# Patient Record
Sex: Female | Born: 1970
Health system: Southern US, Community
[De-identification: ages and names within clinical notes are randomized; demographics above are authoritative.]

## PROBLEM LIST (undated history)

## (undated) DIAGNOSIS — K582 Mixed irritable bowel syndrome: Secondary | ICD-10-CM

## (undated) DIAGNOSIS — F32A Depression, unspecified: Secondary | ICD-10-CM

## (undated) DIAGNOSIS — R945 Abnormal results of liver function studies: Secondary | ICD-10-CM

## (undated) DIAGNOSIS — G43909 Migraine, unspecified, not intractable, without status migrainosus: Secondary | ICD-10-CM

## (undated) DIAGNOSIS — G4700 Insomnia, unspecified: Secondary | ICD-10-CM

## (undated) DIAGNOSIS — E559 Vitamin D deficiency, unspecified: Secondary | ICD-10-CM

## (undated) DIAGNOSIS — F329 Major depressive disorder, single episode, unspecified: Secondary | ICD-10-CM

## (undated) DIAGNOSIS — R7989 Other specified abnormal findings of blood chemistry: Secondary | ICD-10-CM

## (undated) DIAGNOSIS — F909 Attention-deficit hyperactivity disorder, unspecified type: Secondary | ICD-10-CM

## (undated) HISTORY — PX: HERNIA REPAIR: SHX51

## (undated) HISTORY — PX: TUBAL LIGATION: SHX77

---

## 2004-07-11 ENCOUNTER — Emergency Department: Payer: Self-pay | Admitting: Emergency Medicine

## 2005-01-30 ENCOUNTER — Emergency Department: Payer: Self-pay | Admitting: Emergency Medicine

## 2005-01-30 ENCOUNTER — Other Ambulatory Visit: Payer: Self-pay

## 2006-04-16 ENCOUNTER — Emergency Department: Payer: Self-pay | Admitting: Unknown Physician Specialty

## 2010-08-05 ENCOUNTER — Ambulatory Visit: Payer: Self-pay | Admitting: Sports Medicine

## 2011-01-26 ENCOUNTER — Ambulatory Visit: Payer: Self-pay | Admitting: Family Medicine

## 2015-06-15 DIAGNOSIS — F909 Attention-deficit hyperactivity disorder, unspecified type: Secondary | ICD-10-CM | POA: Insufficient documentation

## 2015-07-07 DIAGNOSIS — F909 Attention-deficit hyperactivity disorder, unspecified type: Secondary | ICD-10-CM | POA: Insufficient documentation

## 2015-07-07 DIAGNOSIS — F32A Depression, unspecified: Secondary | ICD-10-CM | POA: Insufficient documentation

## 2015-07-07 DIAGNOSIS — F329 Major depressive disorder, single episode, unspecified: Secondary | ICD-10-CM | POA: Insufficient documentation

## 2015-07-07 DIAGNOSIS — G43909 Migraine, unspecified, not intractable, without status migrainosus: Secondary | ICD-10-CM | POA: Insufficient documentation

## 2015-07-26 ENCOUNTER — Other Ambulatory Visit: Payer: Self-pay | Admitting: Internal Medicine

## 2015-07-26 DIAGNOSIS — R945 Abnormal results of liver function studies: Secondary | ICD-10-CM

## 2015-07-26 DIAGNOSIS — R7989 Other specified abnormal findings of blood chemistry: Secondary | ICD-10-CM

## 2015-08-02 ENCOUNTER — Ambulatory Visit
Admission: RE | Admit: 2015-08-02 | Discharge: 2015-08-02 | Disposition: A | Discharge status: Home / Self Care | Payer: BLUE CROSS/BLUE SHIELD | Source: Ambulatory Visit | Attending: Internal Medicine | Admitting: Internal Medicine

## 2015-08-02 DIAGNOSIS — R945 Abnormal results of liver function studies: Secondary | ICD-10-CM

## 2015-08-02 DIAGNOSIS — R7989 Other specified abnormal findings of blood chemistry: Secondary | ICD-10-CM | POA: Insufficient documentation

## 2016-04-20 ENCOUNTER — Other Ambulatory Visit: Payer: Self-pay | Admitting: Internal Medicine

## 2016-04-20 DIAGNOSIS — R1084 Generalized abdominal pain: Secondary | ICD-10-CM

## 2016-04-28 ENCOUNTER — Ambulatory Visit
Admission: RE | Admit: 2016-04-28 | Discharge: 2016-04-28 | Disposition: A | Discharge status: Home / Self Care | Payer: BLUE CROSS/BLUE SHIELD | Source: Ambulatory Visit | Attending: Internal Medicine | Admitting: Internal Medicine

## 2016-04-28 ENCOUNTER — Encounter: Payer: Self-pay | Admitting: Radiology

## 2016-04-28 DIAGNOSIS — R1084 Generalized abdominal pain: Secondary | ICD-10-CM | POA: Diagnosis present

## 2016-04-28 MED ORDER — IOPAMIDOL (ISOVUE-300) INJECTION 61%
100.0000 mL | Freq: Once | INTRAVENOUS | Status: AC | PRN
  Administered 2016-04-28: 100 mL via INTRAVENOUS

## 2016-10-18 DIAGNOSIS — F5104 Psychophysiologic insomnia: Secondary | ICD-10-CM | POA: Insufficient documentation

## 2016-10-18 DIAGNOSIS — K582 Mixed irritable bowel syndrome: Secondary | ICD-10-CM | POA: Insufficient documentation

## 2017-02-01 ENCOUNTER — Other Ambulatory Visit: Payer: Self-pay | Admitting: Internal Medicine

## 2017-02-01 DIAGNOSIS — R1084 Generalized abdominal pain: Secondary | ICD-10-CM

## 2017-02-05 ENCOUNTER — Ambulatory Visit
Admission: RE | Admit: 2017-02-05 | Discharge: 2017-02-05 | Disposition: A | Discharge status: Home / Self Care | Payer: BLUE CROSS/BLUE SHIELD | Source: Ambulatory Visit | Attending: Internal Medicine | Admitting: Internal Medicine

## 2017-02-05 DIAGNOSIS — R1084 Generalized abdominal pain: Secondary | ICD-10-CM | POA: Insufficient documentation

## 2017-02-05 MED ORDER — IOPAMIDOL (ISOVUE-300) INJECTION 61%
100.0000 mL | Freq: Once | INTRAVENOUS | Status: AC | PRN
  Administered 2017-02-05: 100 mL via INTRAVENOUS

## 2017-04-26 ENCOUNTER — Other Ambulatory Visit: Payer: Self-pay | Admitting: Internal Medicine

## 2017-04-26 DIAGNOSIS — R945 Abnormal results of liver function studies: Secondary | ICD-10-CM

## 2017-04-26 DIAGNOSIS — R7989 Other specified abnormal findings of blood chemistry: Secondary | ICD-10-CM | POA: Insufficient documentation

## 2017-05-01 ENCOUNTER — Ambulatory Visit
Admission: RE | Admit: 2017-05-01 | Discharge: 2017-05-01 | Disposition: A | Discharge status: Home / Self Care | Payer: 59 | Source: Ambulatory Visit | Attending: Internal Medicine | Admitting: Internal Medicine

## 2017-05-01 DIAGNOSIS — R945 Abnormal results of liver function studies: Secondary | ICD-10-CM

## 2017-05-01 DIAGNOSIS — R932 Abnormal findings on diagnostic imaging of liver and biliary tract: Secondary | ICD-10-CM | POA: Diagnosis not present

## 2017-05-01 DIAGNOSIS — R7989 Other specified abnormal findings of blood chemistry: Secondary | ICD-10-CM

## 2017-09-26 DIAGNOSIS — E559 Vitamin D deficiency, unspecified: Secondary | ICD-10-CM | POA: Insufficient documentation

## 2018-04-15 ENCOUNTER — Other Ambulatory Visit: Payer: Self-pay | Admitting: Internal Medicine

## 2018-04-15 DIAGNOSIS — M7989 Other specified soft tissue disorders: Secondary | ICD-10-CM

## 2018-04-19 ENCOUNTER — Ambulatory Visit
Admission: RE | Admit: 2018-04-19 | Discharge: 2018-04-19 | Disposition: A | Discharge status: Home / Self Care | Payer: 59 | Source: Ambulatory Visit | Attending: Internal Medicine | Admitting: Internal Medicine

## 2018-04-19 DIAGNOSIS — M7989 Other specified soft tissue disorders: Secondary | ICD-10-CM | POA: Insufficient documentation

## 2018-07-11 ENCOUNTER — Other Ambulatory Visit: Payer: Self-pay

## 2018-07-11 ENCOUNTER — Encounter: Payer: Self-pay | Admitting: Emergency Medicine

## 2018-07-11 ENCOUNTER — Inpatient Hospital Stay
Admission: EM | Admit: 2018-07-11 | Discharge: 2018-07-15 | DRG: 440 | Disposition: A | Discharge status: Home / Self Care | Payer: 59 | Attending: Internal Medicine | Admitting: Internal Medicine

## 2018-07-11 ENCOUNTER — Emergency Department: Payer: 59

## 2018-07-11 DIAGNOSIS — E559 Vitamin D deficiency, unspecified: Secondary | ICD-10-CM | POA: Diagnosis present

## 2018-07-11 DIAGNOSIS — K2980 Duodenitis without bleeding: Secondary | ICD-10-CM | POA: Diagnosis present

## 2018-07-11 DIAGNOSIS — Z79899 Other long term (current) drug therapy: Secondary | ICD-10-CM | POA: Diagnosis not present

## 2018-07-11 DIAGNOSIS — Z9851 Tubal ligation status: Secondary | ICD-10-CM

## 2018-07-11 DIAGNOSIS — E860 Dehydration: Secondary | ICD-10-CM | POA: Diagnosis present

## 2018-07-11 DIAGNOSIS — K589 Irritable bowel syndrome without diarrhea: Secondary | ICD-10-CM | POA: Diagnosis present

## 2018-07-11 DIAGNOSIS — G4700 Insomnia, unspecified: Secondary | ICD-10-CM | POA: Diagnosis present

## 2018-07-11 DIAGNOSIS — K852 Alcohol induced acute pancreatitis without necrosis or infection: Principal | ICD-10-CM | POA: Diagnosis present

## 2018-07-11 DIAGNOSIS — E876 Hypokalemia: Secondary | ICD-10-CM | POA: Diagnosis present

## 2018-07-11 DIAGNOSIS — K760 Fatty (change of) liver, not elsewhere classified: Secondary | ICD-10-CM | POA: Diagnosis present

## 2018-07-11 DIAGNOSIS — Z803 Family history of malignant neoplasm of breast: Secondary | ICD-10-CM | POA: Diagnosis not present

## 2018-07-11 DIAGNOSIS — Z888 Allergy status to other drugs, medicaments and biological substances status: Secondary | ICD-10-CM | POA: Diagnosis not present

## 2018-07-11 DIAGNOSIS — K859 Acute pancreatitis without necrosis or infection, unspecified: Secondary | ICD-10-CM

## 2018-07-11 DIAGNOSIS — F909 Attention-deficit hyperactivity disorder, unspecified type: Secondary | ICD-10-CM | POA: Diagnosis present

## 2018-07-11 DIAGNOSIS — K219 Gastro-esophageal reflux disease without esophagitis: Secondary | ICD-10-CM | POA: Diagnosis present

## 2018-07-11 HISTORY — DX: Abnormal results of liver function studies: R94.5

## 2018-07-11 HISTORY — DX: Migraine, unspecified, not intractable, without status migrainosus: G43.909

## 2018-07-11 HISTORY — DX: Mixed irritable bowel syndrome: K58.2

## 2018-07-11 HISTORY — DX: Other specified abnormal findings of blood chemistry: R79.89

## 2018-07-11 HISTORY — DX: Depression, unspecified: F32.A

## 2018-07-11 HISTORY — DX: Vitamin D deficiency, unspecified: E55.9

## 2018-07-11 HISTORY — DX: Insomnia, unspecified: G47.00

## 2018-07-11 HISTORY — DX: Major depressive disorder, single episode, unspecified: F32.9

## 2018-07-11 HISTORY — DX: Attention-deficit hyperactivity disorder, unspecified type: F90.9

## 2018-07-11 LAB — URINALYSIS, COMPLETE (UACMP) WITH MICROSCOPIC
Bilirubin Urine: NEGATIVE
GLUCOSE, UA: NEGATIVE mg/dL
HGB URINE DIPSTICK: NEGATIVE
Ketones, ur: NEGATIVE mg/dL
Leukocytes, UA: NEGATIVE
NITRITE: NEGATIVE
PH: 6 (ref 5.0–8.0)
PROTEIN: NEGATIVE mg/dL
Specific Gravity, Urine: 1.011 (ref 1.005–1.030)

## 2018-07-11 LAB — CBC
HCT: 36.6 % (ref 36.0–46.0)
Hemoglobin: 13.1 g/dL (ref 12.0–15.0)
MCH: 35.2 pg — ABNORMAL HIGH (ref 26.0–34.0)
MCHC: 35.8 g/dL (ref 30.0–36.0)
MCV: 98.4 fL (ref 80.0–100.0)
PLATELETS: 407 10*3/uL — AB (ref 150–400)
RBC: 3.72 MIL/uL — AB (ref 3.87–5.11)
RDW: 14.8 % (ref 11.5–15.5)
WBC: 16.4 10*3/uL — ABNORMAL HIGH (ref 4.0–10.5)
nRBC: 0 % (ref 0.0–0.2)

## 2018-07-11 LAB — POCT PREGNANCY, URINE
Preg Test, Ur: NEGATIVE
Preg Test, Ur: NEGATIVE

## 2018-07-11 LAB — LIPID PANEL
CHOL/HDL RATIO: 2.2 ratio
CHOLESTEROL: 175 mg/dL (ref 0–200)
HDL: 81 mg/dL (ref >40)
LDL Cholesterol: 51 mg/dL (ref 0–99)
Triglycerides: 216 mg/dL — ABNORMAL HIGH (ref <150)
VLDL: 43 mg/dL — ABNORMAL HIGH (ref 0–40)

## 2018-07-11 LAB — COMPREHENSIVE METABOLIC PANEL
ALT: 27 U/L (ref 0–44)
ANION GAP: 12 (ref 5–15)
AST: 60 U/L — ABNORMAL HIGH (ref 15–41)
Albumin: 4.2 g/dL (ref 3.5–5.0)
Alkaline Phosphatase: 95 U/L (ref 38–126)
BUN: 9 mg/dL (ref 6–20)
CHLORIDE: 96 mmol/L — AB (ref 98–111)
CO2: 25 mmol/L (ref 22–32)
Calcium: 9 mg/dL (ref 8.9–10.3)
Creatinine, Ser: 0.54 mg/dL (ref 0.44–1.00)
GFR calc Af Amer: >60 mL/min (ref >60)
GFR calc non Af Amer: >60 mL/min (ref >60)
GLUCOSE: 107 mg/dL — AB (ref 70–99)
POTASSIUM: 4.4 mmol/L (ref 3.5–5.1)
SODIUM: 133 mmol/L — AB (ref 135–145)
TOTAL PROTEIN: 7.3 g/dL (ref 6.5–8.1)
Total Bilirubin: 0.9 mg/dL (ref 0.3–1.2)

## 2018-07-11 LAB — LIPASE, BLOOD: LIPASE: 515 U/L — AB (ref 11–51)

## 2018-07-11 MED ORDER — FOLIC ACID 1 MG PO TABS
1.0000 mg | ORAL_TABLET | Freq: Every day | ORAL | Status: DC
  Administered 2018-07-11 – 2018-07-15 (×4): 1 mg via ORAL
  Filled 2018-07-11 (×4): qty 1

## 2018-07-11 MED ORDER — ONDANSETRON HCL 4 MG/2ML IJ SOLN
4.0000 mg | Freq: Four times a day (QID) | INTRAMUSCULAR | Status: DC | PRN
  Administered 2018-07-12 (×2): 4 mg via INTRAVENOUS
  Filled 2018-07-11 (×2): qty 2

## 2018-07-11 MED ORDER — VITAMIN B-1 100 MG PO TABS
100.0000 mg | ORAL_TABLET | Freq: Every day | ORAL | Status: DC
  Administered 2018-07-13 – 2018-07-15 (×3): 100 mg via ORAL
  Filled 2018-07-11 (×3): qty 1

## 2018-07-11 MED ORDER — ADULT MULTIVITAMIN W/MINERALS CH
1.0000 | ORAL_TABLET | Freq: Every day | ORAL | Status: DC
  Administered 2018-07-11 – 2018-07-15 (×4): 1 via ORAL
  Filled 2018-07-11 (×4): qty 1

## 2018-07-11 MED ORDER — THIAMINE HCL 100 MG/ML IJ SOLN
100.0000 mg | Freq: Every day | INTRAMUSCULAR | Status: DC
  Administered 2018-07-12: 100 mg via INTRAVENOUS
  Filled 2018-07-11: qty 2

## 2018-07-11 MED ORDER — HYDROMORPHONE HCL 1 MG/ML IJ SOLN
1.0000 mg | INTRAMUSCULAR | Status: DC | PRN
  Administered 2018-07-11 – 2018-07-12 (×3): 1 mg via INTRAVENOUS
  Filled 2018-07-11 (×3): qty 1

## 2018-07-11 MED ORDER — SODIUM CHLORIDE 0.9 % IV BOLUS
1000.0000 mL | Freq: Once | INTRAVENOUS | Status: AC
  Administered 2018-07-11: 1000 mL via INTRAVENOUS

## 2018-07-11 MED ORDER — LACTATED RINGERS IV SOLN
INTRAVENOUS | Status: DC
  Administered 2018-07-11 – 2018-07-15 (×13): via INTRAVENOUS

## 2018-07-11 MED ORDER — HYDROMORPHONE HCL 1 MG/ML IJ SOLN
INTRAMUSCULAR | Status: AC
  Administered 2018-07-11: 1 mg via INTRAVENOUS
  Filled 2018-07-11: qty 1

## 2018-07-11 MED ORDER — ZOLPIDEM TARTRATE 5 MG PO TABS: 5.0000 mg | ORAL_TABLET | Freq: Every day | ORAL | Status: DC

## 2018-07-11 MED ORDER — ONDANSETRON HCL 4 MG/2ML IJ SOLN
INTRAMUSCULAR | Status: AC
  Administered 2018-07-11: 4 mg via INTRAVENOUS
  Filled 2018-07-11: qty 2

## 2018-07-11 MED ORDER — IOPAMIDOL (ISOVUE-300) INJECTION 61%
100.0000 mL | Freq: Once | INTRAVENOUS | Status: AC | PRN
  Administered 2018-07-11: 100 mL via INTRAVENOUS

## 2018-07-11 MED ORDER — ONDANSETRON HCL 4 MG/2ML IJ SOLN
4.0000 mg | Freq: Once | INTRAMUSCULAR | Status: AC
  Administered 2018-07-11: 4 mg via INTRAVENOUS
  Filled 2018-07-11: qty 2

## 2018-07-11 MED ORDER — ONDANSETRON HCL 4 MG PO TABS: 4.0000 mg | ORAL_TABLET | Freq: Four times a day (QID) | ORAL | Status: DC | PRN

## 2018-07-11 MED ORDER — ZOLPIDEM TARTRATE 5 MG PO TABS
5.0000 mg | ORAL_TABLET | Freq: Every day | ORAL | Status: DC
  Administered 2018-07-11 – 2018-07-14 (×3): 5 mg via ORAL
  Filled 2018-07-11 (×3): qty 1

## 2018-07-11 MED ORDER — ACETAMINOPHEN 325 MG PO TABS
650.0000 mg | ORAL_TABLET | Freq: Four times a day (QID) | ORAL | Status: DC | PRN
  Administered 2018-07-13 – 2018-07-15 (×3): 650 mg via ORAL
  Filled 2018-07-11 (×3): qty 2

## 2018-07-11 MED ORDER — LORAZEPAM 2 MG/ML IJ SOLN
0.0000 mg | Freq: Four times a day (QID) | INTRAMUSCULAR | Status: AC
  Administered 2018-07-12 (×2): 2 mg via INTRAVENOUS
  Filled 2018-07-11 (×2): qty 1

## 2018-07-11 MED ORDER — HYDROMORPHONE HCL 1 MG/ML IJ SOLN
1.0000 mg | Freq: Once | INTRAMUSCULAR | Status: AC
  Administered 2018-07-11: 1 mg via INTRAVENOUS
  Filled 2018-07-11: qty 1

## 2018-07-11 MED ORDER — LORAZEPAM 1 MG PO TABS: 1.0000 mg | ORAL_TABLET | Freq: Four times a day (QID) | ORAL | Status: AC | PRN

## 2018-07-11 MED ORDER — ONDANSETRON 4 MG PO TBDP
ORAL_TABLET | ORAL | Status: AC
  Administered 2018-07-11: 4 mg via ORAL
  Filled 2018-07-11: qty 1

## 2018-07-11 MED ORDER — LORAZEPAM 2 MG/ML IJ SOLN: 0.0000 mg | Freq: Two times a day (BID) | INTRAMUSCULAR | Status: DC

## 2018-07-11 MED ORDER — ONDANSETRON 4 MG PO TBDP
4.0000 mg | ORAL_TABLET | Freq: Once | ORAL | Status: AC | PRN
  Administered 2018-07-11: 4 mg via ORAL

## 2018-07-11 MED ORDER — LORAZEPAM 2 MG/ML IJ SOLN
1.0000 mg | Freq: Four times a day (QID) | INTRAMUSCULAR | Status: AC | PRN
  Administered 2018-07-12: 1 mg via INTRAVENOUS
  Filled 2018-07-11: qty 1

## 2018-07-11 MED ORDER — SENNOSIDES-DOCUSATE SODIUM 8.6-50 MG PO TABS: 1.0000 | ORAL_TABLET | Freq: Every evening | ORAL | Status: DC | PRN

## 2018-07-11 MED ORDER — LORAZEPAM 2 MG/ML IJ SOLN
1.0000 mg | Freq: Once | INTRAMUSCULAR | Status: AC
Start: 2018-07-11 — End: 2018-07-11
  Administered 2018-07-11: 1 mg via INTRAVENOUS
  Filled 2018-07-11: qty 1

## 2018-07-11 MED ORDER — ACETAMINOPHEN 650 MG RE SUPP: 650.0000 mg | Freq: Four times a day (QID) | RECTAL | Status: DC | PRN

## 2018-07-11 MED ORDER — ENOXAPARIN SODIUM 40 MG/0.4ML ~~LOC~~ SOLN
40.0000 mg | SUBCUTANEOUS | Status: DC
  Administered 2018-07-11 – 2018-07-14 (×4): 40 mg via SUBCUTANEOUS
  Filled 2018-07-11 (×4): qty 0.4

## 2018-07-11 NOTE — ED Provider Notes (Signed)
[facility] Emergency Department Provider Note  ____________________________________________  Time seen: Approximately 7:06 PM  I have reviewed the triage vital signs and the nursing notes.   HISTORY  Chief Complaint Abdominal Pain    HPI Nicolette Bangndrea L Saurage is a 47 y.o. female with a history of ADHD depression and IBS who complains of upper abdominal pain radiating to the back for the past 2 days, constant, waxing and waning.  Decreased appetite and oral intake.  Nausea and vomiting for the past 24 hours as well.  No diarrhea or constipation.  No fever or chills.  Never had pain like this before.  No history of biliary disease.  Pain is severe and achy.     Past Medical History:  Diagnosis Date  . Abnormal LFTs   . Adult ADHD   . Depression   . Insomnia   . Irritable bowel syndrome with both constipation and diarrhea   . Migraines   . Vitamin D deficiency      Patient Active Problem List   Diagnosis Date Noted  . Acute pancreatitis 07/11/2018     Past Surgical History:  Procedure Laterality Date  . HERNIA REPAIR    . TUBAL LIGATION       Prior to Admission medications   Not on File  None   Allergies Meloxicam   No family history on file.  Social History Social History   Tobacco Use  . Smoking status: Never Smoker  . Smokeless tobacco: Never Used  Substance Use Topics  . Alcohol use: Yes  . Drug use: Never    Review of Systems  Constitutional:   No fever or chills.  ENT:   No sore throat. No rhinorrhea. Cardiovascular:   No chest pain or syncope. Respiratory:   No dyspnea or cough. Gastrointestinal:   Positive as above abdominal pain and vomiting Musculoskeletal:   Negative for focal pain or swelling All other systems reviewed and are negative except as documented above in ROS and HPI.  ____________________________________________   PHYSICAL EXAM:  VITAL SIGNS: ED Triage Vitals  Enc Vitals Group     BP 07/11/18  1627 (!) 150/91     Pulse Rate 07/11/18 1627 94     Resp 07/11/18 1627 16     Temp 07/11/18 1627 98.5 F (36.9 C)     Temp Source 07/11/18 1627 Oral     SpO2 07/11/18 1627 98 %     Weight 07/11/18 1627 163 lb (73.9 kg)     Height 07/11/18 1627 5\' 3"  (1.6 m)     Head Circumference --      Peak Flow --      Pain Score 07/11/18 1633 8     Pain Loc --      Pain Edu? --      Excl. in GC? --     Vital signs reviewed, nursing assessments reviewed.   Constitutional:   Alert and oriented. Non-toxic appearance. Eyes:   Conjunctivae are normal. EOMI. PERRL. ENT      Head:   Normocephalic and atraumatic.      Nose:   No congestion/rhinnorhea.       Mouth/Throat:   Dry mucous, no pharyngeal erythema. No peritonsillar mass.       Neck:   No meningismus. Full ROM. Hematological/Lymphatic/Immunilogical:   No cervical lymphadenopathy. Cardiovascular:   RRR. Symmetric bilateral radial and DP pulses.  No murmurs. Cap refill less than 2 seconds. Respiratory:   Normal respiratory effort without tachypnea/retractions.  Breath sounds are clear and equal bilaterally. No wheezes/rales/rhonchi. Gastrointestinal:   Soft with generalized tenderness and guarding.  No rebound or rigidity.  Pronounced tenderness in the epigastrium.. Non distended. There is no CVA tenderness.    Musculoskeletal:   Normal range of motion in all extremities. No joint effusions.  No lower extremity tenderness.  No edema. Neurologic:   Normal speech and language.  Motor grossly intact. No acute focal neurologic deficits are appreciated.  Skin:    Skin is warm, dry and intact. No rash noted.  No petechiae, purpura, or bullae.  ____________________________________________    LABS (pertinent positives/negatives) (all labs ordered are listed, but only abnormal results are displayed) Labs Reviewed  LIPASE, BLOOD - Abnormal; Notable for the following components:      Result Value   Lipase 515 (*)    All other components within  normal limits  COMPREHENSIVE METABOLIC PANEL - Abnormal; Notable for the following components:   Sodium 133 (*)    Chloride 96 (*)    Glucose, Bld 107 (*)    AST 60 (*)    All other components within normal limits  CBC - Abnormal; Notable for the following components:   WBC 16.4 (*)    RBC 3.72 (*)    MCH 35.2 (*)    Platelets 407 (*)    All other components within normal limits  URINALYSIS, COMPLETE (UACMP) WITH MICROSCOPIC - Abnormal; Notable for the following components:   Color, Urine YELLOW (*)    APPearance HAZY (*)    Bacteria, UA RARE (*)    All other components within normal limits  LIPID PANEL  POCT PREGNANCY, URINE  POCT PREGNANCY, URINE  POC URINE PREG, ED   ____________________________________________   EKG    ____________________________________________    RADIOLOGY  No results found.  ____________________________________________   PROCEDURES Procedures  ____________________________________________  DIFFERENTIAL DIAGNOSIS   Bowel perforation, obstruction, ileus, pancreatititis, biliary disease  CLINICAL IMPRESSION / ASSESSMENT AND PLAN / ED COURSE  Pertinent labs & imaging results that were available during my care of the patient were reviewed by me and considered in my medical decision making (see chart for details).      Clinical Course as of Jul 11 1908  Thu Jul 11, 2018  1750 Patient presents with upper abdominal pain radiating to the back.  Lipase is 500 consistent with pancreatitis.  However she has a leukocytosis of 16,000 and severe generalized tenderness without rebound or rigidity.  Presentation is somewhat vague but concerning for perforation obstruction or biliary disease.  I will obtain a CT scan to further evaluate.  Dilaudid 1 mg IV, Zofran 4 mill grams IV, liter of saline bolus for rehydration.  Plan to admit.   [PS]    Clinical Course User Index [PS] Sharman Cheek, MD      ----------------------------------------- 7:11 PM on 07/11/2018 -----------------------------------------  Still waiting for report on CT imaging.  By my review there is pancreatitis of the head of the pancreas.  I do not see any obvious free air or pseudocyst/abscess.. ____________________________________________   FINAL CLINICAL IMPRESSION(S) / ED DIAGNOSES    Final diagnoses:  Acute pancreatitis, unspecified complication status, unspecified pancreatitis type     ED Discharge Orders    None      Portions of this note were generated with dragon dictation software. Dictation errors may occur despite best attempts at proofreading.    Sharman Cheek, MD 07/11/18 [phone]

## 2018-07-11 NOTE — H&P (Signed)
[facility] [place] [facility]   PATIENT NAME: Tabitha Carr    MR#:  [id number]  DATE OF BIRTH:  1971-02-16  DATE OF ADMISSION:  07/11/2018  PRIMARY CARE PHYSICIAN: Marguarite Arbour, MD   REQUESTING/REFERRING PHYSICIAN:   CHIEF COMPLAINT:   Chief Complaint  Patient presents with  . Abdominal Pain    HISTORY OF PRESENT ILLNESS: Tabitha Carr  is a 47 y.o. female with a known history of depression, vitamin D deficiency, alcohol abuse presented to the emergency room for abdominal pain.  Abdominal pain has been going on for 1 day sharp in nature located in the epigastrium and right upper quadrant.  Patient has some nausea and vomiting for the last few days.  The abdominal pain is sharp and 7 out of 10 on a scale of 1-10.  Patient was evaluated in the emergency room her lipase level was elevated.  CT abdomen was reviewed which showed inflammation around the pancreas and duodenal area.  Hospitalist service was consulted for further care.  PAST MEDICAL HISTORY:   Past Medical History:  Diagnosis Date  . Abnormal LFTs   . Adult ADHD   . Depression   . Insomnia   . Irritable bowel syndrome with both constipation and diarrhea   . Migraines   . Vitamin D deficiency     PAST SURGICAL HISTORY:  Past Surgical History:  Procedure Laterality Date  . HERNIA REPAIR    . TUBAL LIGATION      SOCIAL HISTORY:  Social History   Tobacco Use  . Smoking status: Never Smoker  . Smokeless tobacco: Never Used  Substance Use Topics  . Alcohol use: Yes    FAMILY HISTORY:  Mother has breast cancer and alive Father has hypertension and alive  DRUG ALLERGIES:  Allergies  Allergen Reactions  . Meloxicam Swelling    REVIEW OF SYSTEMS:   CONSTITUTIONAL: No fever, fatigue or weakness.  EYES: No blurred or double vision.  EARS, NOSE, AND THROAT: No tinnitus or ear pain.  RESPIRATORY: No cough, shortness of breath, wheezing or hemoptysis.  CARDIOVASCULAR:  No chest pain, orthopnea, edema.  GASTROINTESTINAL: Has nausea, vomiting,  abdominal pain.  No diarrhea GENITOURINARY: No dysuria, hematuria.  ENDOCRINE: No polyuria, nocturia,  HEMATOLOGY: No anemia, easy bruising or bleeding SKIN: No rash or lesion. MUSCULOSKELETAL: No joint pain or arthritis.   NEUROLOGIC: No tingling, numbness, weakness.  PSYCHIATRY: No anxiety or depression.   MEDICATIONS AT HOME:  Prior to Admission medications   Medication Sig Start Date End Date Taking? Authorizing Provider  Cholecalciferol (VITAMIN D3) 125 MCG (5000 UT) TABS Take 5,000 Units by mouth at bedtime.   Yes [provider]  HYDROcodone-homatropine (HYCODAN) 5-1.5 MG/5ML syrup Take 5 mLs by mouth every 6 (six) hours as needed for cough.    Yes [provider]  pantoprazole (PROTONIX) 40 MG tablet Take 40 mg by mouth at bedtime.   Yes [provider]  phentermine (ADIPEX-P) 37.5 MG tablet Take 37.5 mg by mouth daily before breakfast.   Yes [provider]  zolpidem (AMBIEN) 10 MG tablet Take 10 mg by mouth at bedtime.   Yes [provider]      PHYSICAL EXAMINATION:   VITAL SIGNS: Blood pressure (!) 154/88, pulse (!) 114, temperature 98.5 F (36.9 C), temperature source Oral, resp. rate 16, height 5\' 3"  (1.6 m), weight 73.9 kg, SpO2 97 %.  GENERAL:  47 y.o.-year-old patient lying in the bed with no acute distress.  EYES: Pupils equal, round, reactive to light and accommodation. No scleral icterus. Extraocular muscles intact.  HEENT: Head atraumatic, normocephalic. Oropharynx dry and nasopharynx clear.  NECK:  Supple, no jugular venous distention. No thyroid enlargement, no tenderness.  LUNGS: Normal breath sounds bilaterally, no wheezing, rales,rhonchi or crepitation. No use of accessory muscles of respiration.  CARDIOVASCULAR: S1, S2 normal. No murmurs, rubs, or gallops.  ABDOMEN: Soft, tenderness in the epigastrium and right upper quadrant,  nondistended. Bowel sounds present. No organomegaly or mass.  EXTREMITIES: No pedal edema, cyanosis, or clubbing.  NEUROLOGIC: Cranial nerves II through XII are intact. Muscle strength 5/5 in all extremities. Sensation intact. Gait not checked.  PSYCHIATRIC: The patient is alert and oriented x 3.  SKIN: No obvious rash, lesion, or ulcer.   LABORATORY PANEL:   CBC Recent Labs  Lab 07/11/18 1628  WBC 16.4*  HGB 13.1  HCT 36.6  PLT 407*  MCV 98.4  MCH 35.2*  MCHC 35.8  RDW 14.8   ------------------------------------------------------------------------------------------------------------------  Chemistries  Recent Labs  Lab 07/11/18 1628  NA 133*  K 4.4  CL 96*  CO2 25  GLUCOSE 107*  BUN 9  CREATININE 0.54  CALCIUM 9.0  AST 60*  ALT 27  ALKPHOS 95  BILITOT 0.9   ------------------------------------------------------------------------------------------------------------------ estimated creatinine clearance is 82.8 mL/min (by C-G formula based on SCr of 0.54 mg/dL). ------------------------------------------------------------------------------------------------------------------ No results for input(s): TSH, T4TOTAL, T3FREE, THYROIDAB in the last 72 hours.  Invalid input(s): FREET3   Coagulation profile No results for input(s): INR, PROTIME in the last 168 hours. ------------------------------------------------------------------------------------------------------------------- No results for input(s): DDIMER in the last 72 hours. -------------------------------------------------------------------------------------------------------------------  Cardiac Enzymes No results for input(s): CKMB, TROPONINI, MYOGLOBIN in the last 168 hours.  Invalid input(s): CK ------------------------------------------------------------------------------------------------------------------ Invalid input(s):  POCBNP  ---------------------------------------------------------------------------------------------------------------  Urinalysis    Component Value Date/Time   COLORURINE YELLOW (A) 07/11/2018 1628   APPEARANCEUR HAZY (A) 07/11/2018 1628   LABSPEC 1.011 07/11/2018 1628   PHURINE 6.0 07/11/2018 1628   GLUCOSEU NEGATIVE 07/11/2018 1628   HGBUR NEGATIVE 07/11/2018 1628   BILIRUBINUR NEGATIVE 07/11/2018 1628   KETONESUR NEGATIVE 07/11/2018 1628   PROTEINUR NEGATIVE 07/11/2018 1628   NITRITE NEGATIVE 07/11/2018 1628   LEUKOCYTESUR NEGATIVE 07/11/2018 1628     RADIOLOGY: Ct Abdomen Pelvis W Contrast  Result Date: 07/11/2018 CLINICAL DATA:  Patient with nausea and vomiting. EXAM: CT ABDOMEN AND PELVIS WITH CONTRAST TECHNIQUE: Multidetector CT imaging of the abdomen and pelvis was performed using the standard protocol following bolus administration of intravenous contrast. CONTRAST:  100mL ISOVUE-300 IOPAMIDOL (ISOVUE-300) INJECTION 61% COMPARISON:  CT abdomen pelvis 02/05/2017 FINDINGS: Lower chest: Normal heart size. No pericardial effusion. Lung bases are clear. No pleural effusion. Hepatobiliary: The liver is normal in size and contour. Subcentimeter too small to characterize low-attenuation lesion right hepatic lobe (image 19; series 2). Gallbladder is unremarkable. No intrahepatic or extrahepatic biliary ductal dilatation. Pancreas: Pancreatic head and uncinate process is edematous with surrounding fluid and inflammatory stranding. Spleen: Unremarkable Adrenals/Urinary Tract: Normal adrenal glands. Kidneys enhance symmetrically with contrast. No hydronephrosis. Urinary bladder is unremarkable. Stomach/Bowel: Wall thickening of the second and third portion of the duodenum. No evidence for small bowel obstruction. No free intraperitoneal air. Vascular/Lymphatic: Normal caliber abdominal aorta. No retroperitoneal lymphadenopathy. Reproductive: Uterus and adnexal structures unremarkable. Other:  None. Musculoskeletal: Lumbar spine degenerative changes. Lower thoracic spine degenerative changes. No aggressive or acute appearing osseous lesions. IMPRESSION: 1. Inflammatory stranding and fluid about the pancreatic head and uncinate process. Additionally, there is wall  thickening of the adjacent second and third portion of the duodenum. Findings are favored to represent focal pancreatitis with reactive inflammatory changes of the duodenum. The possibility of a primary duodenitis is not entirely excluded. Electronically Signed   By: Annia Belt M.D.   On: 07/11/2018 19:14    EKG: Orders placed or performed in visit on 01/30/05  . EKG 12-Lead    IMPRESSION AND PLAN:  47 year old female patient with history of depression, migraines, vitamin D deficiency presented to the emergency room for abdominal pain nausea and vomiting  -Acute pancreatitis Admit patient to medical floor IV fluid hydration Clear liquid diet Monitor lipase level Pain management with IV Dilaudid  -Acute duodenitis Start patient on proton pump inhibitor GI consultation  -Dehydration With IV fluids  -DVT prophylaxis with subcu Lovenox daily  -History of alcohol use Last evening 2 days ago CIWA protocol  All the records are reviewed and case discussed with ED provider. Management plans discussed with the patient, family and they are in agreement.  CODE STATUS:Full code    TOTAL TIME TAKING CARE OF THIS PATIENT: 52 minutes.    Ihor Austin M.D on 07/11/2018 at 7:20 PM  Between 7am to 6pm - Pager - [phone]  After 6pm go to [email or web address] - password [email or web address] [facility]  [facility] [facility]  Office  [phone]  CC: Primary care physician; Marguarite Arbour, MD

## 2018-07-11 NOTE — Progress Notes (Signed)
Advanced care plan.  Purpose of the Encounter: CODE STATUS  Parties in Attendance: Patient  Patient's Decision Capacity: Good  Subjective/Patient's story: Presented to the emergency room for abdominal pain, nausea and vomiting   Objective/Medical story Patient has acute pancreatitis needs IV fluids and pain management   Goals of care determination:  Advance care directives and goals of care and treatment plan discussed Patient wants everything done for now   CODE STATUS: Full code   Time spent discussing advanced care planning: 16 minutes

## 2018-07-11 NOTE — ED Triage Notes (Signed)
Patient reports nausea and vomiting x2 days with abdominal pain starting this afternoon. Patient reports pain got worse when doctor at [facility] palpated. Pain worse in RUQ.

## 2018-07-11 NOTE — ED Notes (Signed)
Pt screaming out for sudden abd pain.

## 2018-07-11 NOTE — ED Notes (Signed)
Pt using restroom. CT here to take pt off unit.

## 2018-07-11 NOTE — ED Notes (Signed)
Urine preg neg.  

## 2018-07-12 DIAGNOSIS — K859 Acute pancreatitis without necrosis or infection, unspecified: Secondary | ICD-10-CM

## 2018-07-12 LAB — CBC
HCT: 34.9 % — ABNORMAL LOW (ref 36.0–46.0)
Hemoglobin: 12.2 g/dL (ref 12.0–15.0)
MCH: 35.6 pg — ABNORMAL HIGH (ref 26.0–34.0)
MCHC: 35 g/dL (ref 30.0–36.0)
MCV: 101.7 fL — AB (ref 80.0–100.0)
NRBC: 0 % (ref 0.0–0.2)
Platelets: 319 10*3/uL (ref 150–400)
RBC: 3.43 MIL/uL — AB (ref 3.87–5.11)
RDW: 14.8 % (ref 11.5–15.5)
WBC: 13.8 10*3/uL — AB (ref 4.0–10.5)

## 2018-07-12 LAB — BASIC METABOLIC PANEL
ANION GAP: 12 (ref 5–15)
BUN: 6 mg/dL (ref 6–20)
CALCIUM: 8.5 mg/dL — AB (ref 8.9–10.3)
CO2: 24 mmol/L (ref 22–32)
CREATININE: 0.65 mg/dL (ref 0.44–1.00)
Chloride: 100 mmol/L (ref 98–111)
GFR calc non Af Amer: >60 mL/min (ref >60)
Glucose, Bld: 107 mg/dL — ABNORMAL HIGH (ref 70–99)
Potassium: 3.3 mmol/L — ABNORMAL LOW (ref 3.5–5.1)
SODIUM: 136 mmol/L (ref 135–145)

## 2018-07-12 LAB — LIPASE, BLOOD: Lipase: 378 U/L — ABNORMAL HIGH (ref 11–51)

## 2018-07-12 MED ORDER — PROMETHAZINE HCL 25 MG/ML IJ SOLN: 25.0000 mg | Freq: Four times a day (QID) | INTRAMUSCULAR | Status: DC | PRN

## 2018-07-12 MED ORDER — POTASSIUM CHLORIDE 10 MEQ/100ML IV SOLN
10.0000 meq | INTRAVENOUS | Status: AC
  Administered 2018-07-12 (×4): 10 meq via INTRAVENOUS
  Filled 2018-07-12 (×4): qty 100

## 2018-07-12 MED ORDER — HYDROMORPHONE HCL 1 MG/ML IJ SOLN
1.0000 mg | INTRAMUSCULAR | Status: DC | PRN
  Administered 2018-07-12: 2 mg via INTRAVENOUS
  Administered 2018-07-12: 1 mg via INTRAVENOUS
  Administered 2018-07-13: 2 mg via INTRAVENOUS
  Filled 2018-07-12: qty 1
  Filled 2018-07-12 (×2): qty 2

## 2018-07-12 MED ORDER — PROMETHAZINE HCL 25 MG/ML IJ SOLN
25.0000 mg | INTRAMUSCULAR | Status: AC
  Administered 2018-07-12: 25 mg via INTRAVENOUS
  Filled 2018-07-12: qty 1

## 2018-07-12 MED ORDER — HYDRALAZINE HCL 20 MG/ML IJ SOLN: 10.0000 mg | Freq: Four times a day (QID) | INTRAMUSCULAR | Status: DC | PRN

## 2018-07-12 MED ORDER — PANTOPRAZOLE SODIUM 40 MG PO TBEC
40.0000 mg | DELAYED_RELEASE_TABLET | Freq: Every day | ORAL | Status: DC
  Administered 2018-07-13 – 2018-07-15 (×3): 40 mg via ORAL
  Filled 2018-07-12 (×3): qty 1

## 2018-07-12 MED ORDER — HYDROMORPHONE HCL 1 MG/ML IJ SOLN
1.0000 mg | INTRAMUSCULAR | Status: DC | PRN
  Administered 2018-07-12 (×2): 1 mg via INTRAVENOUS
  Filled 2018-07-12 (×2): qty 1

## 2018-07-12 NOTE — Consult Note (Signed)
[name] , MD [address], [address], [place], [place], [address] [address], [place], [place], [address] Phone: [phone]  Fax: [phone]  Consultation  Referring Provider:  Dr Nemiah Commander Primary Care Physician:  Marguarite Arbour, MD Primary Gastroenterologist: [facility]  Reason for Consultation:     Acute pancreatitisd  Date of Admission:  07/11/2018 Date of Consultation:  07/12/2018         HPI:   Tabitha Carr is a 47 y.o. female.  She has been seen by [facility] previously for abnormal LFTs.  Known to have hepatic steatosis on imaging.  She also carries a diagnosis of IBS.  Previously abnormal liver function tests have been attributed to fatty liver as well as alcohol use.  As per the last GI note in October 2019 she had reduced alcohol intake but had not stopped.  She does take phentermine for weight loss.  She presented to the hospital yesterday at 7:20 PM.  She presented with abdominal pain of 1 day in duration.  CT scan of the abdomen demonstrated features suggestive of pancreatitis.  Additionally there is wall thickening adjacent to the second and third portion of the duodenum indicating some possibility of inflammation of the other duodenum including duodenitis.  On admission her white cell count of 13.8, hemoglobin 12.2 g.,  MCV 101.7.  Lipase 515, normal total bilirubin, alkaline phosphatase, ALT.  AST minimally elevated at 60.  CT scan of the abdomen showed no biliary dilation.  When I went into the room to see the patient she had her father her mother also present but she was very drowsy as she received some pain medications.  She appeared comfortable and she stated that this was the first time that she had had pancreatitis.  She has been drinking about 4 beers every single day for many years.  Denies any new medications except phentermine.  And a Z-Pak which she took recently.  She states the pain is better at this point of time.    Past Medical History:    Diagnosis Date  . Abnormal LFTs   . Adult ADHD   . Depression   . Insomnia   . Irritable bowel syndrome with both constipation and diarrhea   . Migraines   . Vitamin D deficiency     Past Surgical History:  Procedure Laterality Date  . HERNIA REPAIR    . TUBAL LIGATION      Prior to Admission medications   Medication Sig Start Date End Date Taking? Authorizing Provider  Cholecalciferol (VITAMIN D3) 125 MCG (5000 UT) TABS Take 5,000 Units by mouth at bedtime.   Yes [provider]  HYDROcodone-homatropine (HYCODAN) 5-1.5 MG/5ML syrup Take 5 mLs by mouth every 6 (six) hours as needed for cough.    Yes [provider]  pantoprazole (PROTONIX) 40 MG tablet Take 40 mg by mouth at bedtime.   Yes [provider]  phentermine (ADIPEX-P) 37.5 MG tablet Take 37.5 mg by mouth daily before breakfast.   Yes [provider]  zolpidem (AMBIEN) 10 MG tablet Take 10 mg by mouth at bedtime.   Yes [provider]    No family history on file.   Social History   Tobacco Use  . Smoking status: Never Smoker  . Smokeless tobacco: Never Used  Substance Use Topics  . Alcohol use: Yes  . Drug use: Never    Allergies as of 07/11/2018 - Review Complete 07/11/2018  Allergen Reaction Noted  .  Meloxicam Swelling 04/28/2016    Review of Systems:    All systems reviewed and negative except where noted in HPI.   Physical Exam:  Vital signs in last 24 hours: Temp:  [97.5 F (36.4 C)-98.8 F (37.1 C)] 97.9 F (36.6 C) (11/22 1306) Pulse Rate:  [82-114] 82 (11/22 1306) Resp:  [14-24] 14 (11/22 1306) BP: (150-179)/(82-97) 179/94 (11/22 1306) SpO2:  [97 %-100 %] 100 % (11/22 1306) Weight:  [73.9 kg-74.5 kg] 74.5 kg (11/21 2000) Last BM Date: 07/11/18 General:   Portable but drowsy Head:  Normocephalic and atraumatic. Eyes:   No icterus.   Conjunctiva pink. PERRLA. Ears:  Normal auditory acuity. Neck:  Supple; no masses or thyroidomegaly Lungs:  Respirations even and unlabored. Lungs clear to auscultation bilaterally.   No wheezes, crackles, or rhonchi.  Heart:  Regular rate and rhythm;  Without murmur, clicks, rubs or gallops Abdomen:  Soft, nondistended mild generalized tenderness. Normal bowel sounds. No appreciable masses or hepatomegaly.  No rebound or guarding.  Neurologic: Drowsy but arousable. Skin:  Intact without significant lesions or rashes. Cervical Nodes:  No significant cervical adenopathy. Psych:  Alert and cooperative. Normal affect.  LAB RESULTS: Recent Labs    07/11/18 1628 07/12/18 0530  WBC 16.4* 13.8*  HGB 13.1 12.2  HCT 36.6 34.9*  PLT 407* 319   BMET Recent Labs    07/11/18 1628 07/12/18 0530  NA 133* 136  K 4.4 3.3*  CL 96* 100  CO2 25 24  GLUCOSE 107* 107*  BUN 9 6  CREATININE 0.54 0.65  CALCIUM 9.0 8.5*   LFT Recent Labs    07/11/18 1628  PROT 7.3  ALBUMIN 4.2  AST 60*  ALT 27  ALKPHOS 95  BILITOT 0.9   PT/INR No results for input(s): LABPROT, INR in the last 72 hours.  STUDIES: Ct Abdomen Pelvis W Contrast  Result Date: 07/11/2018 CLINICAL DATA:  Patient with nausea and vomiting. EXAM: CT ABDOMEN AND PELVIS WITH CONTRAST TECHNIQUE: Multidetector CT imaging of the abdomen and pelvis was performed using the standard protocol following bolus administration of intravenous contrast. CONTRAST:  100mL ISOVUE-300 IOPAMIDOL (ISOVUE-300) INJECTION 61% COMPARISON:  CT abdomen pelvis 02/05/2017 FINDINGS: Lower chest: Normal heart size. No pericardial effusion. Lung bases are clear. No pleural effusion. Hepatobiliary: The liver is normal in size and contour. Subcentimeter too small to characterize low-attenuation lesion right hepatic lobe (image 19; series 2). Gallbladder is unremarkable. No intrahepatic or extrahepatic biliary ductal dilatation. Pancreas: Pancreatic head and uncinate process is edematous with surrounding fluid and inflammatory stranding. Spleen: Unremarkable Adrenals/Urinary  Tract: Normal adrenal glands. Kidneys enhance symmetrically with contrast. No hydronephrosis. Urinary bladder is unremarkable. Stomach/Bowel: Wall thickening of the second and third portion of the duodenum. No evidence for small bowel obstruction. No free intraperitoneal air. Vascular/Lymphatic: Normal caliber abdominal aorta. No retroperitoneal lymphadenopathy. Reproductive: Uterus and adnexal structures unremarkable. Other: None. Musculoskeletal: Lumbar spine degenerative changes. Lower thoracic spine degenerative changes. No aggressive or acute appearing osseous lesions. IMPRESSION: 1. Inflammatory stranding and fluid about the pancreatic head and uncinate process. Additionally, there is wall thickening of the adjacent second and third portion of the duodenum. Findings are favored to represent focal pancreatitis with reactive inflammatory changes of the duodenum. The possibility of a primary duodenitis is not entirely excluded. Electronically Signed   By: Annia Beltrew  Davis M.D.   On: 07/11/2018 19:14      Impression / Plan:   Nicolette Bangndrea L Saurage is a 47 y.o. y/o female with a history  of chronic alcohol use presents to the emergency room with features suggestive of acute pancreatitis.  Likely etiology secondary to alcohol.  There is no biliary dilation on the CAT scan and liver function tests are not grossly abnormal which makes biliary pancreatitis less likely.  Not on any medications which are strongly associated with pancreatitis.  Phentermine which I did note she is on is not often associated with pancreatitis.   Plan 1.  Suggest generous IV fluids with Ringer lactate which would be the fluid of choice. 2.  Analgesia. 3.  Commence on clears when patient feels hungry and ready to taken. 4.  Stop all alcohol use completely. 5.  Check IgG4 level and she could benefit from an MRCP as an outpatient to rule out pancreatic divisum or any pancreatic tumors in 4 to 6 weeks time.  Thank you for involving me in  the care of this patient.      LOS: 1 day   [name], MD  07/12/2018, 1:14 PM

## 2018-07-12 NOTE — Plan of Care (Signed)
Patient has complaints of nausea/vomiting.  Given zofran and pain medication.

## 2018-07-12 NOTE — Progress Notes (Signed)
   07/12/18 1200  Clinical Encounter Type  Visited With Patient and family together  Visit Type Initial;Spiritual support  Recommendations Follow-up, if requested.  Spiritual Encounters  Spiritual Needs Other (Comment) (Did not need pastoral care.)   Chaplain engaged the patient and family.  There were no pastoral needs at this time.

## 2018-07-12 NOTE — Progress Notes (Signed)
Called Dr. Marjie SkiffSridharan regarding changing frequency of pain medication due to patient's elevated pain level.  Appropriate orders were placed.  Arturo MortonClay, Reginold Beale N  07/12/2018  4:38 AM

## 2018-07-12 NOTE — Progress Notes (Signed)
[employer] - [place] [facility]   PATIENT NAME: Tabitha Carr    MR#:  [id number]  DATE OF BIRTH:  07-03-1971  SUBJECTIVE:  CHIEF COMPLAINT:   Chief Complaint  Patient presents with  . Abdominal Pain   -Admitted with abdominal pain and noted to have acute pancreatitis -Still complains of significant abdominal pain no nausea  REVIEW OF SYSTEMS:  Review of Systems  Constitutional: Negative for chills, fever and malaise/fatigue.  HENT: Negative for ear discharge, hearing loss and nosebleeds.   Eyes: Negative for blurred vision and double vision.  Respiratory: Negative for cough, shortness of breath and wheezing.   Cardiovascular: Negative for chest pain and palpitations.  Gastrointestinal: Positive for abdominal pain and nausea. Negative for constipation, diarrhea and vomiting.  Genitourinary: Negative for dysuria.  Musculoskeletal: Negative for myalgias.  Neurological: Negative for dizziness, focal weakness, seizures, weakness and headaches.  Psychiatric/Behavioral: Negative for depression.    DRUG ALLERGIES:   Allergies  Allergen Reactions  . Meloxicam Swelling    VITALS:  Blood pressure (!) 179/94, pulse 82, temperature 97.9 F (36.6 C), temperature source Oral, resp. rate 14, height 5\' 3"  (1.6 m), weight 74.5 kg, SpO2 100 %.  PHYSICAL EXAMINATION:  Physical Exam   GENERAL:  47 y.o.-year-old patient lying in the bed with no acute distress.  EYES: Pupils equal, round, reactive to light and accommodation. No scleral icterus. Extraocular muscles intact.  HEENT: Head atraumatic, normocephalic. Oropharynx and nasopharynx clear.  NECK:  Supple, no jugular venous distention. No thyroid enlargement, no tenderness.  LUNGS: Normal breath sounds bilaterally, no wheezing, rales,rhonchi or crepitation. No use of accessory muscles of respiration.  CARDIOVASCULAR: S1, S2 normal. No murmurs, rubs, or gallops.  ABDOMEN: Soft, tender in the epigastrium and  left flank regions with voluntary guarding, no rigidity or rebound tenderness.  Nondistended. Bowel sounds present. No organomegaly or mass.  EXTREMITIES: No pedal edema, cyanosis, or clubbing.  NEUROLOGIC: Cranial nerves II through XII are intact. Muscle strength 5/5 in all extremities. Sensation intact. Gait not checked.  PSYCHIATRIC: The patient is alert and oriented x 3.  Sleepy from the pain medications SKIN: No obvious rash, lesion, or ulcer.    LABORATORY PANEL:   CBC Recent Labs  Lab 07/12/18 0530  WBC 13.8*  HGB 12.2  HCT 34.9*  PLT 319   ------------------------------------------------------------------------------------------------------------------  Chemistries  Recent Labs  Lab 07/11/18 1628 07/12/18 0530  NA 133* 136  K 4.4 3.3*  CL 96* 100  CO2 25 24  GLUCOSE 107* 107*  BUN 9 6  CREATININE 0.54 0.65  CALCIUM 9.0 8.5*  AST 60*  --   ALT 27  --   ALKPHOS 95  --   BILITOT 0.9  --    ------------------------------------------------------------------------------------------------------------------  Cardiac Enzymes No results for input(s): TROPONINI in the last 168 hours. ------------------------------------------------------------------------------------------------------------------  RADIOLOGY:  Ct Abdomen Pelvis W Contrast  Result Date: 07/11/2018 CLINICAL DATA:  Patient with nausea and vomiting. EXAM: CT ABDOMEN AND PELVIS WITH CONTRAST TECHNIQUE: Multidetector CT imaging of the abdomen and pelvis was performed using the standard protocol following bolus administration of intravenous contrast. CONTRAST:  100mL ISOVUE-300 IOPAMIDOL (ISOVUE-300) INJECTION 61% COMPARISON:  CT abdomen pelvis 02/05/2017 FINDINGS: Lower chest: Normal heart size. No pericardial effusion. Lung bases are clear. No pleural effusion. Hepatobiliary: The liver is normal in size and contour. Subcentimeter too small to characterize low-attenuation lesion right hepatic lobe (image 19;  series 2). Gallbladder is unremarkable. No intrahepatic or extrahepatic biliary ductal dilatation. Pancreas:  Pancreatic head and uncinate process is edematous with surrounding fluid and inflammatory stranding. Spleen: Unremarkable Adrenals/Urinary Tract: Normal adrenal glands. Kidneys enhance symmetrically with contrast. No hydronephrosis. Urinary bladder is unremarkable. Stomach/Bowel: Wall thickening of the second and third portion of the duodenum. No evidence for small bowel obstruction. No free intraperitoneal air. Vascular/Lymphatic: Normal caliber abdominal aorta. No retroperitoneal lymphadenopathy. Reproductive: Uterus and adnexal structures unremarkable. Other: None. Musculoskeletal: Lumbar spine degenerative changes. Lower thoracic spine degenerative changes. No aggressive or acute appearing osseous lesions. IMPRESSION: 1. Inflammatory stranding and fluid about the pancreatic head and uncinate process. Additionally, there is wall thickening of the adjacent second and third portion of the duodenum. Findings are favored to represent focal pancreatitis with reactive inflammatory changes of the duodenum. The possibility of a primary duodenitis is not entirely excluded. Electronically Signed   By: Annia Belt M.D.   On: 07/11/2018 19:14    EKG:   Orders placed or performed in visit on 01/30/05  . EKG 12-Lead    ASSESSMENT AND PLAN:   47 year old female with past medical history significant for IBS, depression anxiety, elevated LFTs as outpatient, alcohol use presents to hospital secondary to abdominal pain  1.  Acute pancreatitis-likely alcohol related -No previous pancreatitis episodes.  Lipase is significantly high. -CT of the abdomen showing pancreatic inflammation but no masses or biliary dilatation -Appreciate GI consult.  Continue n.p.o. status, generous IV fluids -Pain and nausea control and monitor -Once resolved, advised no alcohol  2.  Hypokalemia-being replaced  3.   GERD-Protonix  4.  DVT prophylaxis-Lovenox     All the records are reviewed and case discussed with Care Management/Social Workerr. Management plans discussed with the patient, family and they are in agreement.  CODE STATUS: Full code  TOTAL TIME TAKING CARE OF THIS PATIENT: 38 minutes.   POSSIBLE D/C IN 1-2 DAYS, DEPENDING ON CLINICAL CONDITION.   Enid Baas M.D on 07/12/2018 at 3:22 PM  Between 7am to 6pm - Pager - [phone]  After 6pm go to [email or web address] - Social research officer, government  [facility]  Office  [phone]  CC: Primary care physician; Marguarite Arbour, MD

## 2018-07-13 LAB — COMPREHENSIVE METABOLIC PANEL
ALT: 15 U/L (ref 0–44)
AST: 22 U/L (ref 15–41)
Albumin: 3.2 g/dL — ABNORMAL LOW (ref 3.5–5.0)
Alkaline Phosphatase: 77 U/L (ref 38–126)
Anion gap: 10 (ref 5–15)
BILIRUBIN TOTAL: 1.1 mg/dL (ref 0.3–1.2)
CALCIUM: 8.5 mg/dL — AB (ref 8.9–10.3)
CO2: 25 mmol/L (ref 22–32)
CREATININE: 0.58 mg/dL (ref 0.44–1.00)
Chloride: 98 mmol/L (ref 98–111)
GFR calc Af Amer: >60 mL/min (ref >60)
Glucose, Bld: 90 mg/dL (ref 70–99)
Potassium: 3.3 mmol/L — ABNORMAL LOW (ref 3.5–5.1)
Sodium: 133 mmol/L — ABNORMAL LOW (ref 135–145)
TOTAL PROTEIN: 6.1 g/dL — AB (ref 6.5–8.1)

## 2018-07-13 LAB — CBC
HCT: 32.7 % — ABNORMAL LOW (ref 36.0–46.0)
Hemoglobin: 11.4 g/dL — ABNORMAL LOW (ref 12.0–15.0)
MCH: 35 pg — ABNORMAL HIGH (ref 26.0–34.0)
MCHC: 34.9 g/dL (ref 30.0–36.0)
MCV: 100.3 fL — ABNORMAL HIGH (ref 80.0–100.0)
Platelets: 195 10*3/uL (ref 150–400)
RBC: 3.26 MIL/uL — ABNORMAL LOW (ref 3.87–5.11)
RDW: 14.9 % (ref 11.5–15.5)
WBC: 10.9 10*3/uL — ABNORMAL HIGH (ref 4.0–10.5)
nRBC: 0 % (ref 0.0–0.2)

## 2018-07-13 LAB — HIV ANTIBODY (ROUTINE TESTING W REFLEX): HIV Screen 4th Generation wRfx: NONREACTIVE

## 2018-07-13 LAB — IGG 4: IgG, Subclass 4: 12 mg/dL (ref 2–96)

## 2018-07-13 MED ORDER — POTASSIUM CHLORIDE CRYS ER 20 MEQ PO TBCR
40.0000 meq | EXTENDED_RELEASE_TABLET | Freq: Once | ORAL | Status: AC
  Administered 2018-07-13: 40 meq via ORAL
  Filled 2018-07-13: qty 2

## 2018-07-13 NOTE — Progress Notes (Signed)
[name] , MD [address], [place], [place], [address] [address], [place], [place], [address] Phone: [phone]  Fax: [phone]   Tabitha Carr is being followed for alcoholic pancreatitis  Day 1 of follow up   Subjective: Feels much better, minimal pain   Objective: Vital signs in last 24 hours: Vitals:   07/12/18 2051 07/12/18 2114 07/13/18 0425 07/13/18 1102  BP: (!) 152/81  (!) 153/75 (!) 157/95  Pulse: (!) 101  (!) 111 90  Resp: 20  20 18   Temp: (!) 101.6 F (38.7 C) 98.7 F (37.1 C) (!) 100.9 F (38.3 C) 98.4 F (36.9 C)  TempSrc: Axillary Oral Oral Oral  SpO2: 99%  97% 99%  Weight:      Height:       Weight change:   Intake/Output Summary (Last 24 hours) at 07/13/2018 1105 Last data filed at 07/13/2018 0854 Gross per 24 hour  Intake 3997.57 ml  Output 4450 ml  Net -452.43 ml     Exam: Heart:: Regular rate and rhythm, S1S2 present or without murmur or extra heart sounds Lungs: normal, clear to auscultation and clear to auscultation and percussion Abdomen: soft, nontender, normal bowel sounds   Lab Results: @LABTEST2 @ Micro Results: No results found for this or any previous visit (from the past 240 hour(s)). Studies/Results: Ct Abdomen Pelvis W Contrast  Result Date: 07/11/2018 CLINICAL DATA:  Patient with nausea and vomiting. EXAM: CT ABDOMEN AND PELVIS WITH CONTRAST TECHNIQUE: Multidetector CT imaging of the abdomen and pelvis was performed using the standard protocol following bolus administration of intravenous contrast. CONTRAST:  ISOVUE-300 IOPAMIDOL (ISOVUE-300) INJECTION 61% COMPARISON:  CT abdomen pelvis 02/05/2017 FINDINGS: Lower chest: Normal heart size. No pericardial effusion. Lung bases are clear. No pleural effusion. Hepatobiliary: The liver is normal in size and contour. Subcentimeter too small to characterize low-attenuation lesion right hepatic lobe (image 19; series 2). Gallbladder is  unremarkable. No intrahepatic or extrahepatic biliary ductal dilatation. Pancreas: Pancreatic head and uncinate process is edematous with surrounding fluid and inflammatory stranding. Spleen: Unremarkable Adrenals/Urinary Tract: Normal adrenal glands. Kidneys enhance symmetrically with contrast. No hydronephrosis. Urinary bladder is unremarkable. Stomach/Bowel: Wall thickening of the second and third portion of the duodenum. No evidence for small bowel obstruction. No free intraperitoneal air. Vascular/Lymphatic: Normal caliber abdominal aorta. No retroperitoneal lymphadenopathy. Reproductive: Uterus and adnexal structures unremarkable. Other: None. Musculoskeletal: Lumbar spine degenerative changes. Lower thoracic spine degenerative changes. No aggressive or acute appearing osseous lesions. IMPRESSION: 1. Inflammatory stranding and fluid about the pancreatic head and uncinate process. Additionally, there is wall thickening of the adjacent second and third portion of the duodenum. Findings are favored to represent focal pancreatitis with reactive inflammatory changes of the duodenum. The possibility of a primary duodenitis is not entirely excluded. Electronically Signed   By: Annia Belt M.D.   On: 07/11/2018 19:14   Medications: I have reviewed the patient's current medications. Scheduled Meds: . enoxaparin (LOVENOX) injection  40 mg Subcutaneous Q24H  . folic acid  1 mg Oral Daily  . LORazepam  0-4 mg Intravenous Q6H   Followed by  . LORazepam  0-4 mg Intravenous Q12H  . multivitamin with minerals  1 tablet Oral Daily  . pantoprazole  40 mg Oral Daily  . potassium chloride  40 mEq Oral Once  . thiamine  100 mg Oral Daily   Or  . thiamine  100 mg Intravenous Daily  . zolpidem  5 mg Oral QHS  Continuous Infusions: . lactated ringers 150 mL/hr at 07/13/18 0854   PRN Meds:.acetaminophen **OR** acetaminophen, hydrALAZINE, HYDROmorphone (DILAUDID) injection, LORazepam **OR** LORazepam, promethazine,  senna-docusate   Assessment: Active Problems:   Acute pancreatitis  Tabitha Carr is a 47 y.o. y/o female with a history of chronic alcohol use presents to the emergency room with features suggestive of acute pancreatitis.  Likely etiology secondary to alcohol.  There is no biliary dilation on the CAT scan and liver function tests are not grossly abnormal which makes biliary pancreatitis less likely.  Not on any medications which are strongly associated with pancreatitis.  Phentermine which I did note she is on is not often associated with pancreatitis.Improving   Plan 1.  IV fluids with Ringer lactate which would be the fluid of choice. 2.  Analgesia. 3.  Commence on clears when patient feels hungry and ready to taken. 4.  Stop all alcohol use completely. 5.  F/u  IgG4 level and she could benefit from an MRCP as an outpatient to rule out pancreatic divisum or any pancreatic tumors in 4 to 6 weeks time.     LOS: 2 days   Wyline MoodKiran Marlin Brys, MD 07/13/2018, 11:05 AM

## 2018-07-13 NOTE — Progress Notes (Signed)
[facility] - [place] at [facility]   PATIENT NAME: Tabitha Carr    MR#:  [id number]  DATE OF BIRTH:  03-Jun-1971  SUBJECTIVE:  CHIEF COMPLAINT:   Patient seen and evaluated today Decreased abdominal pain No vomitings Mild nausea  REVIEW OF SYSTEMS:  Review of Systems  Constitutional: Negative for chills, fever and malaise/fatigue.  HENT: Negative for ear discharge, hearing loss and nosebleeds.   Eyes: Negative for blurred vision and double vision.  Respiratory: Negative for cough, shortness of breath and wheezing.   Cardiovascular: Negative for chest pain and palpitations.  Gastrointestinal: Positive for abdominal pain and nausea. Negative for constipation, diarrhea and vomiting.  Genitourinary: Negative for dysuria.  Musculoskeletal: Negative for myalgias.  Neurological: Negative for dizziness, focal weakness, seizures, weakness and headaches.  Psychiatric/Behavioral: Negative for depression.    DRUG ALLERGIES:   Allergies  Allergen Reactions  . Meloxicam Swelling    VITALS:  Blood pressure (!) 157/95, pulse 90, temperature 98.4 F (36.9 C), temperature source Oral, resp. rate 18, height 5\' 3"  (1.6 m), weight 74.5 kg, SpO2 99 %.  PHYSICAL EXAMINATION:  Physical Exam   GENERAL:  47 y.o.-year-old patient lying in the bed with no acute distress.  EYES: Pupils equal, round, reactive to light and accommodation. No scleral icterus. Extraocular muscles intact.  HEENT: Head atraumatic, normocephalic. Oropharynx and nasopharynx clear.  NECK:  Supple, no jugular venous distention. No thyroid enlargement, no tenderness.  LUNGS: Normal breath sounds bilaterally, no wheezing, rales,rhonchi or crepitation. No use of accessory muscles of respiration.  CARDIOVASCULAR: S1, S2 normal. No murmurs, rubs, or gallops.  ABDOMEN: Soft, decreased tendernes in the epigastrium and left flank regions with voluntary guarding, no rigidity or rebound tenderness.  Nondistended.  Bowel sounds present. No organomegaly or mass.  EXTREMITIES: No pedal edema, cyanosis, or clubbing.  NEUROLOGIC: Cranial nerves II through XII are intact. Muscle strength 5/5 in all extremities. Sensation intact. Gait not checked.  PSYCHIATRIC: The patient is alert and oriented x 3.  Sleepy from the pain medications SKIN: No obvious rash, lesion, or ulcer.    LABORATORY PANEL:   CBC Recent Labs  Lab 07/12/18 0530  WBC 13.8*  HGB 12.2  HCT 34.9*  PLT 319   ------------------------------------------------------------------------------------------------------------------  Chemistries  Recent Labs  Lab 07/13/18 0631  NA 133*  K 3.3*  CL 98  CO2 25  GLUCOSE 90  BUN <5*  CREATININE 0.58  CALCIUM 8.5*  AST 22  ALT 15  ALKPHOS 77  BILITOT 1.1   ------------------------------------------------------------------------------------------------------------------  Cardiac Enzymes No results for input(s): TROPONINI in the last 168 hours. ------------------------------------------------------------------------------------------------------------------  RADIOLOGY:  Ct Abdomen Pelvis W Contrast  Result Date: 07/11/2018 CLINICAL DATA:  Patient with nausea and vomiting. EXAM: CT ABDOMEN AND PELVIS WITH CONTRAST TECHNIQUE: Multidetector CT imaging of the abdomen and pelvis was performed using the standard protocol following bolus administration of intravenous contrast. CONTRAST:  ISOVUE-300 IOPAMIDOL (ISOVUE-300) INJECTION 61% COMPARISON:  CT abdomen pelvis 02/05/2017 FINDINGS: Lower chest: Normal heart size. No pericardial effusion. Lung bases are clear. No pleural effusion. Hepatobiliary: The liver is normal in size and contour. Subcentimeter too small to characterize low-attenuation lesion right hepatic lobe (image 19; series 2). Gallbladder is unremarkable. No intrahepatic or extrahepatic biliary ductal dilatation. Pancreas: Pancreatic head and uncinate process is edematous with  surrounding fluid and inflammatory stranding. Spleen: Unremarkable Adrenals/Urinary Tract: Normal adrenal glands. Kidneys enhance symmetrically with contrast. No hydronephrosis. Urinary bladder is unremarkable. Stomach/Bowel: Wall thickening of the second and third  portion of the duodenum. No evidence for small bowel obstruction. No free intraperitoneal air. Vascular/Lymphatic: Normal caliber abdominal aorta. No retroperitoneal lymphadenopathy. Reproductive: Uterus and adnexal structures unremarkable. Other: None. Musculoskeletal: Lumbar spine degenerative changes. Lower thoracic spine degenerative changes. No aggressive or acute appearing osseous lesions. IMPRESSION: 1. Inflammatory stranding and fluid about the pancreatic head and uncinate process. Additionally, there is wall thickening of the adjacent second and third portion of the duodenum. Findings are favored to represent focal pancreatitis with reactive inflammatory changes of the duodenum. The possibility of a primary duodenitis is not entirely excluded. Electronically Signed   By: Annia Beltrew  Davis M.D.   On: 07/11/2018 19:14    EKG:   Orders placed or performed in visit on 01/30/05  . EKG 12-Lead    ASSESSMENT AND PLAN:   47 year old female with past medical history significant for IBS, depression anxiety, elevated LFTs as outpatient, alcohol use presents to hospital secondary to abdominal pain  1.  Acute pancreatitis-likely alcohol related improving slowly -No previous pancreatitis episodes.  Follow-up lipase levels -CT of the abdomen showing pancreatic inflammation but no masses or biliary dilatation -Appreciate GI consult.  Start clear liquid diet today and advance diet as tolerated -Pain and nausea control and monitor -Once resolved, advised no alcohol  2.  Hypokalemia-being replaced  3.  GERD-Protonix  4.  DVT prophylaxis-Lovenox  5.  Leukocytosis Follow-up WBC count     All the records are reviewed and case discussed with  Care Management/Social Workerr. Management plans discussed with the patient, family and they are in agreement.  CODE STATUS: Full code  TOTAL TIME TAKING CARE OF THIS PATIENT: 35 minutes.   POSSIBLE D/C IN 1-2 DAYS, DEPENDING ON CLINICAL CONDITION.   Ihor AustinPavan Pyreddy M.D on 07/13/2018 at 1:25 PM  Between 7am to 6pm - Pager - [phone]  After 6pm go to [email or web address] - Social research officer, governmentpassword [email or web address]  [facility]  Office  [phone]  CC: Primary care physician; Marguarite ArbourSparks, Jeffrey D, MD

## 2018-07-14 LAB — BASIC METABOLIC PANEL
ANION GAP: 13 (ref 5–15)
BUN: 7 mg/dL (ref 6–20)
CHLORIDE: 100 mmol/L (ref 98–111)
CO2: 22 mmol/L (ref 22–32)
Calcium: 8.7 mg/dL — ABNORMAL LOW (ref 8.9–10.3)
Creatinine, Ser: 0.67 mg/dL (ref 0.44–1.00)
GFR calc Af Amer: >60 mL/min (ref >60)
GFR calc non Af Amer: >60 mL/min (ref >60)
Glucose, Bld: 67 mg/dL — ABNORMAL LOW (ref 70–99)
POTASSIUM: 3.8 mmol/L (ref 3.5–5.1)
SODIUM: 135 mmol/L (ref 135–145)

## 2018-07-14 LAB — CBC
HCT: 34.6 % — ABNORMAL LOW (ref 36.0–46.0)
HEMOGLOBIN: 12 g/dL (ref 12.0–15.0)
MCH: 35.2 pg — AB (ref 26.0–34.0)
MCHC: 34.7 g/dL (ref 30.0–36.0)
MCV: 101.5 fL — ABNORMAL HIGH (ref 80.0–100.0)
Platelets: 249 10*3/uL (ref 150–400)
RBC: 3.41 MIL/uL — AB (ref 3.87–5.11)
RDW: 14.9 % (ref 11.5–15.5)
WBC: 14.7 10*3/uL — ABNORMAL HIGH (ref 4.0–10.5)
nRBC: 0 % (ref 0.0–0.2)

## 2018-07-14 NOTE — Progress Notes (Signed)
[facility] - [place] at [facility]   PATIENT NAME: Tabitha Carr    MR#:  [id number]  DATE OF BIRTH:  [birth year]  SUBJECTIVE:  CHIEF COMPLAINT:   Patient seen and evaluated today Had one episode of bad abdominal pain yesterday No vomitings Tolerated Jell-O and liquid diet  REVIEW OF SYSTEMS:  Review of Systems  Constitutional: Negative for chills, fever and malaise/fatigue.  HENT: Negative for ear discharge, hearing loss and nosebleeds.   Eyes: Negative for blurred vision and double vision.  Respiratory: Negative for cough, shortness of breath and wheezing.   Cardiovascular: Negative for chest pain and palpitations.  Gastrointestinal: Positive for abdominal pain and nausea. Negative for constipation, diarrhea and vomiting.  Genitourinary: Negative for dysuria.  Musculoskeletal: Negative for myalgias.  Neurological: Negative for dizziness, focal weakness, seizures, weakness and headaches.  Psychiatric/Behavioral: Negative for depression.    DRUG ALLERGIES:   Allergies  Allergen Reactions  . Meloxicam Swelling    VITALS:  Blood pressure (!) 145/83, pulse 89, temperature 98.9 F (37.2 C), temperature source Oral, resp. rate 20, height 5\' 3"  (1.6 m), weight 74.5 kg, SpO2 98 %.  PHYSICAL EXAMINATION:  Physical Exam   GENERAL:  47 y.o.-year-old patient lying in the bed with no acute distress.  EYES: Pupils equal, round, reactive to light and accommodation. No scleral icterus. Extraocular muscles intact.  HEENT: Head atraumatic, normocephalic. Oropharynx and nasopharynx clear.  NECK:  Supple, no jugular venous distention. No thyroid enlargement, no tenderness.  LUNGS: Normal breath sounds bilaterally, no wheezing, rales,rhonchi or crepitation. No use of accessory muscles of respiration.  CARDIOVASCULAR: S1, S2 normal. No murmurs, rubs, or gallops.  ABDOMEN: Soft, decreased tendernes in the epigastrium and left flank regions with voluntary guarding, no  rigidity or rebound tenderness.  Nondistended. Bowel sounds present. No organomegaly or mass.  EXTREMITIES: No pedal edema, cyanosis, or clubbing.  NEUROLOGIC: Cranial nerves II through XII are intact. Muscle strength 5/5 in all extremities. Sensation intact. Gait not checked.  PSYCHIATRIC: The patient is alert and oriented x 3.  Sleepy from the pain medications SKIN: No obvious rash, lesion, or ulcer.    LABORATORY PANEL:   CBC Recent Labs  Lab 07/14/18 0624  WBC 14.7*  HGB 12.0  HCT 34.6*  PLT 249   ------------------------------------------------------------------------------------------------------------------  Chemistries  Recent Labs  Lab 07/13/18 0631 07/14/18 0624  NA 133* 135  K 3.3* 3.8  CL 98 100  CO2 25 22  GLUCOSE 90 67*  BUN <5* 7  CREATININE 0.58 0.67  CALCIUM 8.5* 8.7*  AST 22  --   ALT 15  --   ALKPHOS 77  --   BILITOT 1.1  --    ------------------------------------------------------------------------------------------------------------------  Cardiac Enzymes No results for input(s): TROPONINI in the last 168 hours. ------------------------------------------------------------------------------------------------------------------  RADIOLOGY:  No results found.  EKG:   Orders placed or performed in visit on 01/30/05  . EKG 12-Lead    ASSESSMENT AND PLAN:   47 year old female with past medical history significant for IBS, depression anxiety, elevated LFTs as outpatient, alcohol use presents to hospital secondary to abdominal pain  1.  Acute pancreatitis ETOH induced -No previous pancreatitis episodes.  Follow-up lipase levels -CT of the abdomen showing pancreatic inflammation but no masses or biliary dilatation -Appreciate GI consult and follow-up -Advance to oral soft diet toay -Pain and nausea control and monitor -Once resolved, advised no alcohol -MRCP abdomen as outpatient  2.  Hypokalemia- replaced  3.  GERD-Protonix  4.  DVT  prophylaxis-Lovenox  5.  Leukocytosis Follow-up counts  6.  Liver function tests have improved      All the records are reviewed and case discussed with Care Management/Social Workerr. Management plans discussed with the patient, family and they are in agreement.  CODE STATUS: Full code  TOTAL TIME TAKING CARE OF THIS PATIENT: 35 minutes.   POSSIBLE D/C IN 1-2 DAYS, DEPENDING ON CLINICAL CONDITION.   Ihor AustinPavan  M.D on 07/14/2018 at 11:03 AM  Between 7am to 6pm - Pager - [phone]  After 6pm go to [email or web address] - Social research officer, governmentpassword [email or web address]  [facility] [place] [facility]  Office  [phone]  CC: Primary care physician; Marguarite ArbourSparks, Jeffrey D, MD

## 2018-07-14 NOTE — Progress Notes (Signed)
   Tabitha Carr , MD [address], [address], [place], [place], [id number] [address], [place], [place], [address] Phone: [phone]  Fax: [phone]   Tabitha Carr is being followed for alcoholic pancreatitis  Day 2 of follow up   Subjective: Feels much better, tolerating a diet    Objective: Vital signs in last 24 hours: Vitals:   07/13/18 1102 07/13/18 1958 07/14/18 0541 07/14/18 1202  BP: (!) 157/95 (!) 161/95 (!) 145/83 (!) 156/90  Pulse: 90 98 89 92  Resp: 18 18 20 15   Temp: 98.4 F (36.9 C) 100.1 F (37.8 C) 98.9 F (37.2 C) 99.2 F (37.3 C)  TempSrc: Oral Oral Oral Oral  SpO2: 99% 97% 98% 100%  Weight:      Height:       Weight change:   Intake/Output Summary (Last 24 hours) at 07/14/2018 1310 Last data filed at 07/14/2018 [id number] Gross per 24 hour  Intake 3565.53 ml  Output 1750 ml  Net 1815.53 ml     Exam: Heart:: Regular rate and rhythm, S1S2 present or without murmur or extra heart sounds Lungs: normal, clear to auscultation and clear to auscultation and percussion Abdomen: soft, nontender, normal bowel sounds   Lab Results: @LABTEST2 @ Micro Results: No results found for this or any previous visit (from the past 240 hour(s)). Studies/Results: No results found. Medications: I have reviewed the patient's current medications. Scheduled Meds: . enoxaparin (LOVENOX) injection  40 mg Subcutaneous Q24H  . folic acid  1 mg Oral Daily  . LORazepam  0-4 mg Intravenous Q12H  . multivitamin with minerals  1 tablet Oral Daily  . pantoprazole  40 mg Oral Daily  . thiamine  100 mg Oral Daily   Or  . thiamine  100 mg Intravenous Daily  . zolpidem  5 mg Oral QHS   Continuous Infusions: . lactated ringers 150 mL/hr at 07/14/18 0927   PRN Meds:.acetaminophen **OR** acetaminophen, hydrALAZINE, HYDROmorphone (DILAUDID) injection, LORazepam **OR** LORazepam, promethazine, senna-docusate   Assessment: Active Problems:   Acute  pancreatitis  Tabitha Gratesndrea L Saurageis a 47 y.o.y/o femalewith a history of chronic alcohol use presented to the emergency room with  Acute alcoholic  pancreatitis..There is no biliary dilation on the CAT scan and liver function tests are not grossly abnormal which makes biliary pancreatitis less likely. Not on any medications which are strongly associated with pancreatitis. Phentermine which I did note she is on is not often associated with pancreatitis.Improving. Igg4 normal    Plan 1. MRCP as an outpatient to rule out pancreatic divisum or any pancreatic tumors in 4 to 6 weeks time. 2. Advance diet as tolerated  3. F/u in my clinic in 2-3 weeks\  I will sign off.  Please call me if any further GI concerns or questions.  We would like to thank you for the opportunity to participate in the care of Tabitha Carr.     LOS: 3 days   Tabitha MoodKiran Matisyn Cabeza, MD 07/14/2018, 1:10 PM

## 2018-07-15 LAB — CBC
HCT: 28.1 % — ABNORMAL LOW (ref 36.0–46.0)
HEMOGLOBIN: 9.9 g/dL — AB (ref 12.0–15.0)
MCH: 35.4 pg — ABNORMAL HIGH (ref 26.0–34.0)
MCHC: 35.2 g/dL (ref 30.0–36.0)
MCV: 100.4 fL — ABNORMAL HIGH (ref 80.0–100.0)
Platelets: 203 10*3/uL (ref 150–400)
RBC: 2.8 MIL/uL — ABNORMAL LOW (ref 3.87–5.11)
RDW: 14.8 % (ref 11.5–15.5)
WBC: 8.7 10*3/uL (ref 4.0–10.5)
nRBC: 0 % (ref 0.0–0.2)

## 2018-07-15 LAB — LIPASE, BLOOD: LIPASE: 178 U/L — AB (ref 11–51)

## 2018-07-15 NOTE — Progress Notes (Signed)
Tabitha Carr  A and O x 4. VSS. Pt tolerating diet well. No complaints of pain or nausea. IV removed intact, no new  prescriptions given. Pt voiced understanding of discharge instructions with no further questions. Pt discharged home.   Allergies as of 07/15/2018      Reactions   Meloxicam Swelling      Medication List    TAKE these medications   HYDROcodone-homatropine 5-1.5 MG/5ML syrup Commonly known as:  HYCODAN Take 5 mLs by mouth every 6 (six) hours as needed for cough.   pantoprazole 40 MG tablet Commonly known as:  PROTONIX Take 40 mg by mouth at bedtime.   phentermine 37.5 MG tablet Commonly known as:  ADIPEX-P Take 37.5 mg by mouth daily before breakfast.   Vitamin D3 125 MCG (5000 UT) Tabs Take 5,000 Units by mouth at bedtime.   zolpidem 10 MG tablet Commonly known as:  AMBIEN Take 10 mg by mouth at bedtime.       Vitals:   07/14/18 2055 07/15/18 0615  BP: (!) 152/94 136/73  Pulse: 97 91  Resp: 20 20  Temp: 99.1 F (37.3 C) 98.7 F (37.1 C)  SpO2: 100% 97%    Tabitha CloudJuan G Rodriguez Carr

## 2018-07-15 NOTE — Discharge Summary (Signed)
[facility] - [place] at [facility]   PATIENT NAME: Tabitha Carr    MR#:  [id number]  DATE OF BIRTH:  Jul 02, 1971  DATE OF ADMISSION:  07/11/2018 ADMITTING PHYSICIAN: Ihor Austin, MD  DATE OF DISCHARGE: 07/15/2018  PRIMARY CARE PHYSICIAN: Marguarite Arbour, MD   ADMISSION DIAGNOSIS:  Acute pancreatitis, unspecified complication status, unspecified pancreatitis type [K85.90] Abdominal pain DISCHARGE DIAGNOSIS:  Active Problems:   Acute pancreatitis Abdominal pain Hypokalemia acute GERD Leukocytosis Duodenitis SECONDARY DIAGNOSIS:   Past Medical History:  Diagnosis Date  . Abnormal LFTs   . Adult ADHD   . Depression   . Insomnia   . Irritable bowel syndrome with both constipation and diarrhea   . Migraines   . Vitamin D deficiency      ADMITTING HISTORY Tabitha Carr  is a 47 y.o. female with a known history of depression, vitamin D deficiency, alcohol abuse presented to the emergency room for abdominal pain.  Abdominal pain has been going on for 1 day sharp in nature located in the epigastrium and right upper quadrant.  Patient has some nausea and vomiting for the last few days.  The abdominal pain is sharp and 7 out of 10 on a scale of 1-10.  Patient was evaluated in the emergency room her lipase level was elevated.  CT abdomen was reviewed which showed inflammation around the pancreas and duodenal area.  Hospitalist service was consulted for further care.  HOSPITAL COURSE:  Patient was admitted to medical floor.  Patient was kept n.p.o. and started on IV fluids.  IV Dilaudid as needed was used for pain management.  Patient was worked up with CT abdomen which showed acute pancreatitis but no necrosis.  Gastroenterology consultation was done.  Lipase levels were monitored.  Patient's abdominal pain improved very slowly.  She was able to tolerate oral liquid diet which was later on advance to soft diet.  Antiemetics were given for nausea and vomiting.   Potassium was supplemented aggressively.  Leukocytosis completely resolved.  Gastroenterology recommended MRCP abdomen as outpatient.  Patient will be discharged home with GI follow-up and follow-up with primary care physician along with oral proton pump inhibitor.  CONSULTS OBTAINED:  Gastroenterology  DRUG ALLERGIES:   Allergies  Allergen Reactions  . Meloxicam Swelling    DISCHARGE MEDICATIONS:   Allergies as of 07/15/2018      Reactions   Meloxicam Swelling      Medication List    TAKE these medications   HYDROcodone-homatropine 5-1.5 MG/5ML syrup Commonly known as:  HYCODAN Take 5 mLs by mouth every 6 (six) hours as needed for cough.   pantoprazole 40 MG tablet Commonly known as:  PROTONIX Take 40 mg by mouth at bedtime.   phentermine 37.5 MG tablet Commonly known as:  ADIPEX-P Take 37.5 mg by mouth daily before breakfast.   Vitamin D3 125 MCG (5000 UT) Tabs Take 5,000 Units by mouth at bedtime.   zolpidem 10 MG tablet Commonly known as:  AMBIEN Take 10 mg by mouth at bedtime.       Today  Patient seen and evaluated today No abdominal pain No nausea and vomiting Tolerating diet well  VITAL SIGNS:  Blood pressure 136/73, pulse 91, temperature 98.7 F (37.1 C), temperature source Oral, resp. rate 20, height 5\' 3"  (1.6 m), weight 74.5 kg, SpO2 97 %.  I/O:    Intake/Output Summary (Last 24 hours) at 07/15/2018 1122 Last data filed at 07/15/2018 1007 Gross per 24 hour  Intake 3812.52  ml  Output 600 ml  Net 3212.52 ml    PHYSICAL EXAMINATION:  Physical Exam  GENERAL:  47 y.o.-year-old patient lying in the bed with no acute distress.  LUNGS: Normal breath sounds bilaterally, no wheezing, rales,rhonchi or crepitation. No use of accessory muscles of respiration.  CARDIOVASCULAR: S1, S2 normal. No murmurs, rubs, or gallops.  ABDOMEN: Soft, non-tender, non-distended. Bowel sounds present. No organomegaly or mass.  NEUROLOGIC: Moves all 4  extremities. PSYCHIATRIC: The patient is alert and oriented x 3.  SKIN: No obvious rash, lesion, or ulcer.   DATA REVIEW:   CBC Recent Labs  Lab 07/15/18 0415  WBC 8.7  HGB 9.9*  HCT 28.1*  PLT 203    Chemistries  Recent Labs  Lab 07/13/18 0631 07/14/18 0624  NA 133* 135  K 3.3* 3.8  CL 98 100  CO2 25 22  GLUCOSE 90 67*  BUN <5* 7  CREATININE 0.58 0.67  CALCIUM 8.5* 8.7*  AST 22  --   ALT 15  --   ALKPHOS 77  --   BILITOT 1.1  --     Cardiac Enzymes No results for input(s): TROPONINI in the last 168 hours.  Microbiology Results  No results found for this or any previous visit.  RADIOLOGY:  No results found.  Follow up with PCP in 1 week.  Management plans discussed with the patient, family and they are in agreement.  CODE STATUS: Full code    Code Status Orders  (From admission, onward)         Start     Ordered   07/11/18 2000  Full code  Continuous     07/11/18 1959        Code Status History    This patient has a current code status but no historical code status.    Advance Directive Documentation     Most Recent Value  Type of Advance Directive  Living will, Healthcare Power of Attorney  Pre-existing out of facility DNR order (yellow form or pink MOST form)  -  "MOST" Form in Place?  -      TOTAL TIME TAKING CARE OF THIS PATIENT ON DAY OF DISCHARGE: more than 34 minutes.   Ihor AustinPavan Heran Campau M.D on 07/15/2018 at 11:22 AM  Between 7am to 6pm - Pager - [phone]  After 6pm go to [email or web address] - password [email or web address] [email or web address]  [facility] [place] [facility]  Office  [phone]  CC: Primary care physician; Marguarite ArbourSparks, Jeffrey D, MD  Note: This dictation was prepared with Dragon dictation along with smaller phrase technology. Any transcriptional errors that result from this process are unintentional.

## 2018-07-31 ENCOUNTER — Other Ambulatory Visit: Payer: Self-pay

## 2018-07-31 ENCOUNTER — Ambulatory Visit (INDEPENDENT_AMBULATORY_CARE_PROVIDER_SITE_OTHER): Payer: 59 | Admitting: Gastroenterology

## 2018-07-31 ENCOUNTER — Encounter: Payer: Self-pay | Admitting: Gastroenterology

## 2018-07-31 VITALS — BP 132/82 | HR 88 | Ht 63.0 in | Wt 161.8 lb

## 2018-07-31 DIAGNOSIS — K852 Alcohol induced acute pancreatitis without necrosis or infection: Secondary | ICD-10-CM

## 2018-07-31 NOTE — Progress Notes (Signed)
Tabitha Carr Avrianna Smart MD, MRCP(U.K) [address]  [address]  [place], [place] [address]  Main: [phone]  Fax: [phone]   Primary Care Physician: Tabitha Carr, Tabitha D, MD  Primary Gastroenterologist:  Dr. Wyline Carr Ithzel Carr   Chief Complaint  Patient presents with  . Hospitalization Follow-up    Pancreatitis    HPI: Tabitha Carr is a 47 y.o. female   Summary of history :  She is here today to see me for a hospital follow up . I was consulted to see her on 07/12/18 when she was admitted with acute pancreatitis likely secondary to alcohol. No biliary dilation on the CAT scan and liver function tests were  grossly abnormal which made biliary pancreatitis less likely.  Not on any medications which are strongly associated with pancreatitis.CT scan of the abdomen demonstrated features suggestive of pancreatitis.  Additionally there is wall thickening adjacent to the second and third portion of the duodenum indicating some possibility of inflammation of the other duodenum including duodenitis  She had been [facility] for abnormal LFTs.  Known to have hepatic steatosis on imaging.  She also carried a diagnosis of IBS.  Previously abnormal liver function tests have been attributed to fatty liver as well as alcohol use.  As per the last GI note in October 2019 she had reduced alcohol intake but had not stopped.  She does take phentermine for weight loss.IGG4 normal    Interval history   07/14/2018-  07/31/2018  07/24/18 : [facility]    No abdominal pain, walked 5 k om thanksgiving.   No major issues. She now recalls she had some tequila prior to the hospital admission.  No alcohol since discharge.   Current Outpatient Medications  Medication Sig Dispense Refill  . Cholecalciferol (VITAMIN D3) 125 MCG (5000 UT) TABS Take 5,000 Units by mouth at bedtime.    . pantoprazole (PROTONIX) 40 MG tablet Take 40 mg by mouth at bedtime.    Marland Kitchen. zolpidem (AMBIEN)  10 MG tablet Take 10 mg by mouth at bedtime.    Marland Kitchen. HYDROcodone-homatropine (HYCODAN) 5-1.5 MG/5ML syrup Take 5 mLs by mouth every 6 (six) hours as needed for cough.     . phentermine (ADIPEX-P) 37.5 MG tablet Take 37.5 mg by mouth daily before breakfast.     No current facility-administered medications for this visit.     Allergies as of 07/31/2018 - Review Complete 07/31/2018  Allergen Reaction Noted  . Latex Other (See Comments) 07/31/2018  . Meloxicam Swelling 04/28/2016    ROS:  General: Negative for anorexia, weight loss, fever, chills, fatigue, weakness. ENT: Negative for hoarseness, difficulty swallowing , nasal congestion. CV: Negative for chest pain, angina, palpitations, dyspnea on exertion, peripheral edema.  Respiratory: Negative for dyspnea at rest, dyspnea on exertion, cough, sputum, wheezing.  GI: See history of present illness. GU:  Negative for dysuria, hematuria, urinary incontinence, urinary frequency, nocturnal urination.  Endo: Negative for unusual weight change.    Physical Examination:   BP 132/82   Pulse 88   Ht 5\' 3"  (1.6 m)   Wt 161 lb 12.8 oz (73.4 kg)   BMI 28.66 kg/m   General: Well-nourished, well-developed in no acute distress.  Eyes: No icterus. Conjunctivae pink. Mouth: Oropharyngeal mucosa moist and pink , no lesions erythema or exudate. Lungs: Clear to auscultation bilaterally. Non-labored. Heart: Regular rate and rhythm, no murmurs rubs or gallops.  Abdomen: Bowel sounds are normal, nontender, nondistended, no hepatosplenomegaly or  masses, no abdominal bruits or hernia , no rebound or guarding.   Extremities: No lower extremity edema. No clubbing or deformities. Neuro: Alert and oriented x 3.  Grossly intact. Skin: Warm and dry, no jaundice.   Psych: Alert and cooperative, normal mood and affect.   Imaging Studies: Ct Abdomen Pelvis W Contrast  Result Date: 07/11/2018 CLINICAL DATA:  Patient with nausea and vomiting. EXAM: CT ABDOMEN  AND PELVIS WITH CONTRAST TECHNIQUE: Multidetector CT imaging of the abdomen and pelvis was performed using the standard protocol following bolus administration of intravenous contrast. CONTRAST:  ISOVUE-300 IOPAMIDOL (ISOVUE-300) INJECTION 61% COMPARISON:  CT abdomen pelvis 02/05/2017 FINDINGS: Lower chest: Normal heart size. No pericardial effusion. Lung bases are clear. No pleural effusion. Hepatobiliary: The liver is normal in size and contour. Subcentimeter too small to characterize low-attenuation lesion right hepatic lobe (image 19; series 2). Gallbladder is unremarkable. No intrahepatic or extrahepatic biliary ductal dilatation. Pancreas: Pancreatic head and uncinate process is edematous with surrounding fluid and inflammatory stranding. Spleen: Unremarkable Adrenals/Urinary Tract: Normal adrenal glands. Kidneys enhance symmetrically with contrast. No hydronephrosis. Urinary bladder is unremarkable. Stomach/Bowel: Wall thickening of the second and third portion of the duodenum. No evidence for small bowel obstruction. No free intraperitoneal air. Vascular/Lymphatic: Normal caliber abdominal aorta. No retroperitoneal lymphadenopathy. Reproductive: Uterus and adnexal structures unremarkable. Other: None. Musculoskeletal: Lumbar spine degenerative changes. Lower thoracic spine degenerative changes. No aggressive or acute appearing osseous lesions. IMPRESSION: 1. Inflammatory stranding and fluid about the pancreatic head and uncinate process. Additionally, there is wall thickening of the adjacent second and third portion of the duodenum. Findings are favored to represent focal pancreatitis with reactive inflammatory changes of the duodenum. The possibility of a primary duodenitis is not entirely excluded. Electronically Signed   By: Annia Belt M.D.   On: 07/11/2018 19:14    Assessment and Plan:   Tabitha Carr is a 47 y.o. y/o female  history of chronic alcohol use here to follow up after her  discharge in 06/2018 for alcoholic pancreatitis. Igg4 normal . Doing well. No abdominal pain, quit alcohol.    Plan 1. MRCP as an outpatient to rule out pancreatic divisum or any pancreatic tumors in mid 08/2018    Dr Tabitha Mood  MD,MRCP [facility]) Follow up in feb 2020

## 2018-08-28 DIAGNOSIS — K852 Alcohol induced acute pancreatitis without necrosis or infection: Secondary | ICD-10-CM | POA: Diagnosis not present

## 2018-08-28 DIAGNOSIS — R945 Abnormal results of liver function studies: Secondary | ICD-10-CM | POA: Diagnosis not present

## 2018-08-28 DIAGNOSIS — Z79899 Other long term (current) drug therapy: Secondary | ICD-10-CM | POA: Diagnosis not present

## 2018-08-28 DIAGNOSIS — N926 Irregular menstruation, unspecified: Secondary | ICD-10-CM | POA: Diagnosis not present

## 2018-09-03 ENCOUNTER — Telehealth: Payer: Self-pay | Admitting: Gastroenterology

## 2018-09-03 NOTE — Telephone Encounter (Signed)
Contacted pt and advised her we received her message and insurance has been updated. MRI has been approved with new insurance.

## 2018-09-03 NOTE — Telephone Encounter (Signed)
Patient called & l/m on answering machine stating she was a Dr Tobi Bastos patient and had an MRCP scheduled for 09-11-2018 but has different [facility] INS member# [id number] [phone]. This insurance has been entered in [employer] verified. Note the ins is under the last name [place].

## 2018-09-11 ENCOUNTER — Ambulatory Visit
Admission: RE | Admit: 2018-09-11 | Discharge: 2018-09-11 | Disposition: A | Discharge status: Home / Self Care | Payer: 59 | Source: Ambulatory Visit | Attending: Gastroenterology | Admitting: Gastroenterology

## 2018-09-11 ENCOUNTER — Other Ambulatory Visit: Payer: Self-pay | Admitting: Gastroenterology

## 2018-09-11 DIAGNOSIS — R933 Abnormal findings on diagnostic imaging of other parts of digestive tract: Secondary | ICD-10-CM | POA: Diagnosis not present

## 2018-09-11 DIAGNOSIS — K7689 Other specified diseases of liver: Secondary | ICD-10-CM | POA: Diagnosis not present

## 2018-09-11 DIAGNOSIS — K852 Alcohol induced acute pancreatitis without necrosis or infection: Secondary | ICD-10-CM | POA: Diagnosis not present

## 2018-09-11 MED ORDER — GADOBUTROL 1 MMOL/ML IV SOLN
7.0000 mL | Freq: Once | INTRAVENOUS | Status: AC | PRN
  Administered 2018-09-11: 7 mL via INTRAVENOUS

## 2018-09-16 ENCOUNTER — Encounter: Payer: Self-pay | Admitting: Gastroenterology

## 2018-09-27 ENCOUNTER — Telehealth: Payer: Self-pay | Admitting: Gastroenterology

## 2018-09-27 NOTE — Telephone Encounter (Signed)
I have called & l/m for patient to call & schedule an appointment with Dr Servando Snare.Patient wanted to change care from Dr Tobi Bastos to DR Servando Snare. This has been oked by Dinah Beers Miki Kins 09-27-2018

## 2018-10-02 DIAGNOSIS — K760 Fatty (change of) liver, not elsewhere classified: Secondary | ICD-10-CM | POA: Insufficient documentation

## 2018-10-09 ENCOUNTER — Ambulatory Visit: Payer: 59 | Admitting: Gastroenterology

## 2018-10-14 DIAGNOSIS — E559 Vitamin D deficiency, unspecified: Secondary | ICD-10-CM | POA: Diagnosis not present

## 2018-10-14 DIAGNOSIS — Z79899 Other long term (current) drug therapy: Secondary | ICD-10-CM | POA: Diagnosis not present

## 2018-10-14 DIAGNOSIS — E7800 Pure hypercholesterolemia, unspecified: Secondary | ICD-10-CM | POA: Diagnosis not present

## 2018-10-21 DIAGNOSIS — R945 Abnormal results of liver function studies: Secondary | ICD-10-CM | POA: Diagnosis not present

## 2018-10-21 DIAGNOSIS — Z0000 Encounter for general adult medical examination without abnormal findings: Secondary | ICD-10-CM | POA: Diagnosis not present

## 2018-10-22 DIAGNOSIS — N898 Other specified noninflammatory disorders of vagina: Secondary | ICD-10-CM | POA: Diagnosis not present

## 2018-10-28 ENCOUNTER — Ambulatory Visit: Payer: 59 | Admitting: Gastroenterology

## 2018-11-14 DIAGNOSIS — N926 Irregular menstruation, unspecified: Secondary | ICD-10-CM | POA: Diagnosis not present

## 2018-11-14 DIAGNOSIS — K769 Liver disease, unspecified: Secondary | ICD-10-CM | POA: Diagnosis not present

## 2018-11-14 DIAGNOSIS — N6452 Nipple discharge: Secondary | ICD-10-CM | POA: Diagnosis not present

## 2018-11-20 DIAGNOSIS — R922 Inconclusive mammogram: Secondary | ICD-10-CM | POA: Diagnosis not present

## 2018-11-20 DIAGNOSIS — N6452 Nipple discharge: Secondary | ICD-10-CM | POA: Diagnosis not present

## 2018-11-21 DIAGNOSIS — R928 Other abnormal and inconclusive findings on diagnostic imaging of breast: Secondary | ICD-10-CM | POA: Diagnosis not present

## 2018-11-21 DIAGNOSIS — R920 Mammographic microcalcification found on diagnostic imaging of breast: Secondary | ICD-10-CM | POA: Diagnosis not present

## 2018-11-21 DIAGNOSIS — Z91040 Latex allergy status: Secondary | ICD-10-CM | POA: Diagnosis not present

## 2018-11-21 DIAGNOSIS — N6011 Diffuse cystic mastopathy of right breast: Secondary | ICD-10-CM | POA: Diagnosis not present

## 2018-11-21 DIAGNOSIS — Z6829 Body mass index (BMI) 29.0-29.9, adult: Secondary | ICD-10-CM | POA: Diagnosis not present

## 2018-11-25 ENCOUNTER — Ambulatory Visit: Payer: 59 | Admitting: Gastroenterology

## 2018-11-27 DIAGNOSIS — N6452 Nipple discharge: Secondary | ICD-10-CM | POA: Diagnosis not present

## 2018-11-27 DIAGNOSIS — R928 Other abnormal and inconclusive findings on diagnostic imaging of breast: Secondary | ICD-10-CM | POA: Diagnosis not present

## 2018-11-27 DIAGNOSIS — N926 Irregular menstruation, unspecified: Secondary | ICD-10-CM | POA: Diagnosis not present

## 2018-12-03 DIAGNOSIS — N921 Excessive and frequent menstruation with irregular cycle: Secondary | ICD-10-CM | POA: Diagnosis not present

## 2018-12-03 DIAGNOSIS — N926 Irregular menstruation, unspecified: Secondary | ICD-10-CM | POA: Diagnosis not present

## 2018-12-18 DIAGNOSIS — N920 Excessive and frequent menstruation with regular cycle: Secondary | ICD-10-CM | POA: Diagnosis not present

## 2018-12-18 DIAGNOSIS — N926 Irregular menstruation, unspecified: Secondary | ICD-10-CM | POA: Diagnosis not present

## 2019-01-01 DIAGNOSIS — R1030 Lower abdominal pain, unspecified: Secondary | ICD-10-CM | POA: Diagnosis not present

## 2019-01-07 ENCOUNTER — Encounter: Payer: Self-pay | Admitting: *Deleted

## 2019-01-20 ENCOUNTER — Ambulatory Visit: Payer: 59 | Admitting: Gastroenterology

## 2019-01-30 IMAGING — MR MR 3D RECON AT SCANNER
20 of 22 series · 44 of 48 positions shown · IV contrast (gadavist)
Comparison: CT abdomen 07/11/2018

CLINICAL DATA: Alcoholic pancreatitis, atypical presentation.

EXAM:
MRI ABDOMEN WITHOUT AND WITH CONTRAST (INCLUDING MRCP)
TECHNIQUE: Multiplanar multisequence MR imaging of the abdomen was performed
both before and after the administration of intravenous contrast.
Heavily T2-weighted images of the biliary and pancreatic ducts were
obtained, and three-dimensional MRCP images were rendered by post
processing.
CONTRAST:  7 cc Gadavist

[Series 3: bSSFP · coronal · 6.0mm · 0.74mm/px · 2 of 30 slices shown]
[im 1/30]
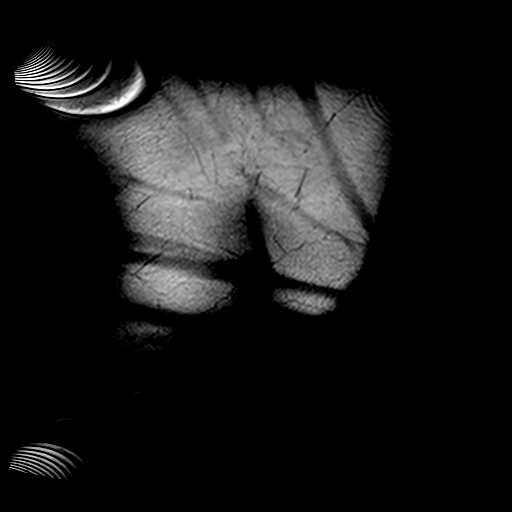
[im 30/30]
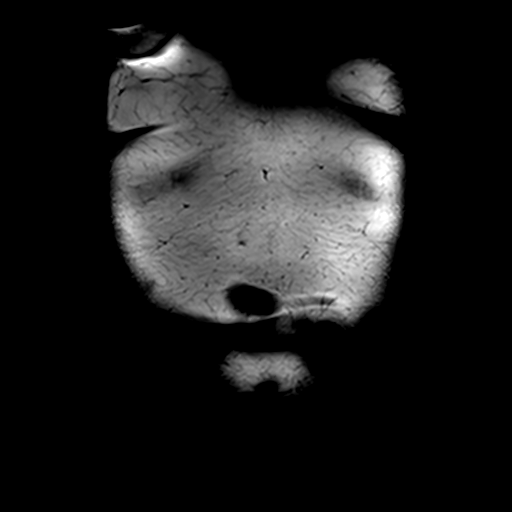

[Series 4: T2 · axial · 6.0mm · 1.19mm/px · z∈[-83,+141]mm · 2 of 32 slices shown]
[im 1/32]
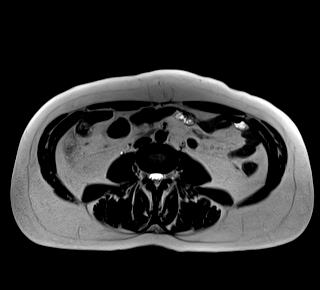
[im 32/32]
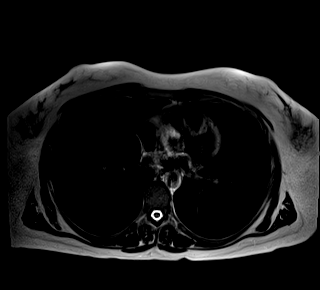

[Series 5: T1 · axial · 6.0mm · 0.74mm/px · z∈[-83,+141]mm · 2 of 32 slices shown (1 of 2)]
[im 1/32]
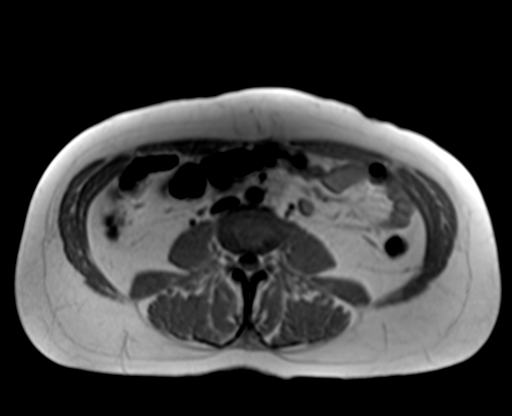
[im 32/32]
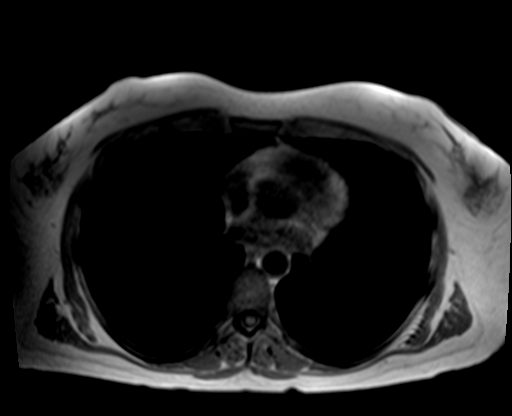

[Series 5: T1 · axial · 6.0mm · 0.74mm/px · z∈[-83,+141]mm · 2 of 32 slices shown (2 of 2)]
[im 1/32]
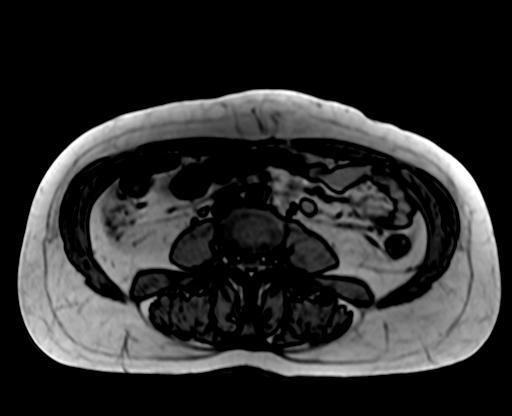
[im 32/32]
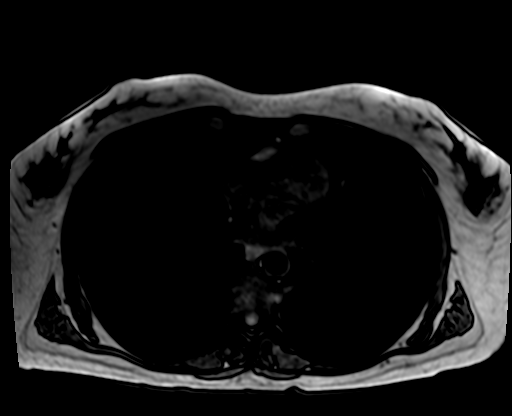

[Series 8: T2 fat-sat · axial · 6.0mm · 1.19mm/px · 1 of 30 slices shown]
[im 1/30]
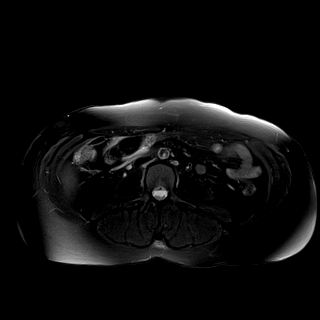

[Series 9: ax dwi_tracew · axial · 6.0mm · 1.42mm/px · 1 of 30 slices shown (1 of 3)]
[im 1/30]
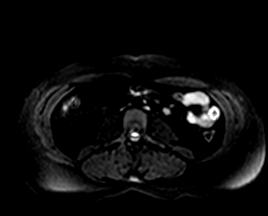

[Series 9: ax dwi_tracew · axial · 6.0mm · 1.42mm/px · 1 of 30 slices shown (2 of 3)]
[im 1/30]
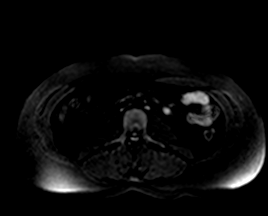

[Series 9: ax dwi_tracew · axial · 6.0mm · 1.42mm/px · 1 of 30 slices shown (3 of 3)]
[im 1/30]
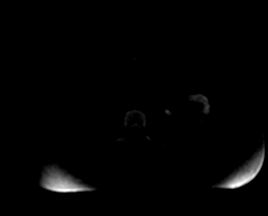

[Series 10: ax dwi_adc · axial · 6.0mm · 1.42mm/px · 1 of 30 slices shown]
[im 1/30]
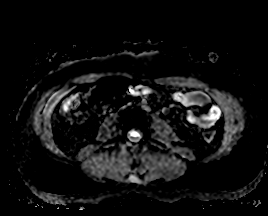

[Series 12: MRCP · coronal · 3.0mm · 1.12mm/px · 1 of 17 slices shown]
[im 1/17]
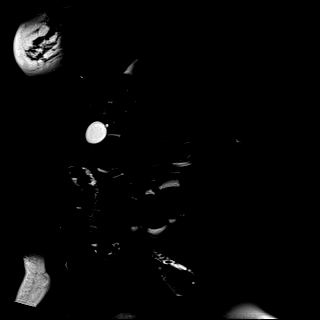

[Series 17: T1 dynamic fat-sat · axial · non-contrast · 3.0mm · 1.19mm/px · z∈[-75,+138]mm · 3 of 72 slices shown (1 of 5)]
[im 1/72]
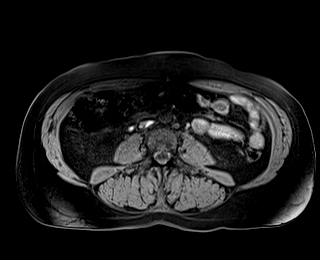
[im 36/72]
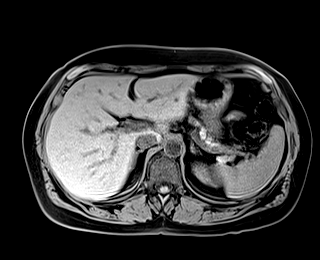
[im 72/72]
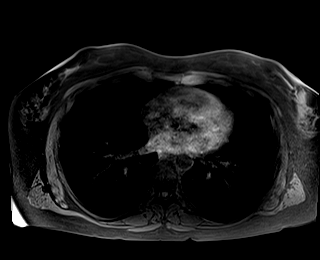

[Series 18: T1 dynamic fat-sat post-contrast · axial · 3.0mm · 1.19mm/px · z∈[-75,+138]mm · 3 of 72 slices shown (1 of 4)]
[im 1/72]
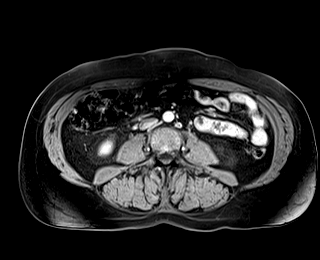
[im 36/72]
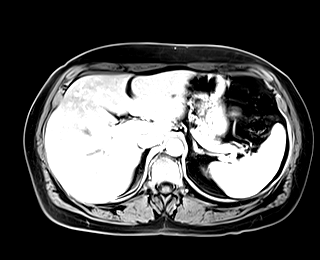
[im 72/72]
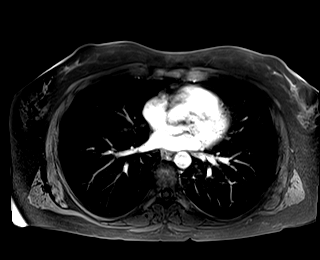

[Series 19: T1 dynamic fat-sat · axial · 3.0mm · 1.19mm/px · z∈[-75,+138]mm · 3 of 72 slices shown (2 of 5)]
[im 1/72]
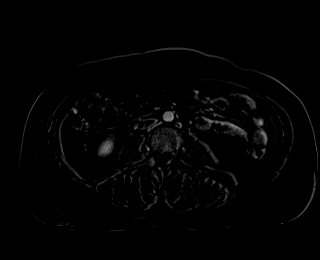
[im 36/72]
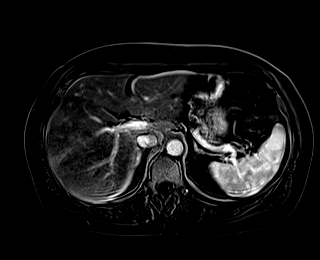
[im 72/72]
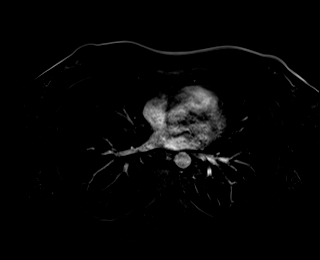

[Series 20: T1 dynamic fat-sat post-contrast · axial · 3.0mm · 1.19mm/px · z∈[-75,+138]mm · 3 of 72 slices shown (2 of 4)]
[im 1/72]
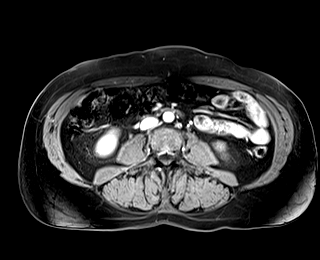
[im 36/72]
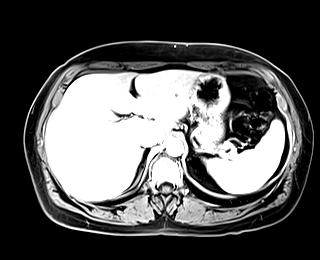
[im 72/72]
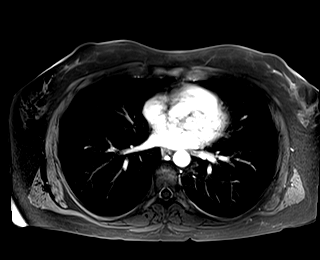

[Series 21: T1 dynamic fat-sat · axial · 3.0mm · 1.19mm/px · z∈[-75,+138]mm · 3 of 72 slices shown (3 of 5)]
[im 1/72]
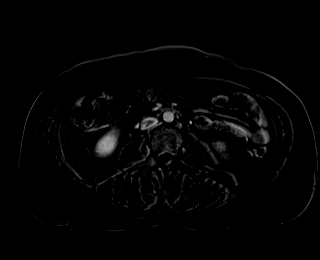
[im 36/72]
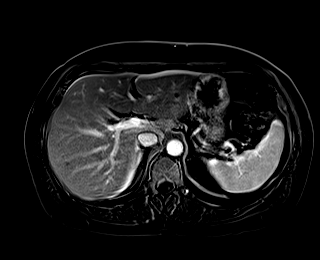
[im 72/72]
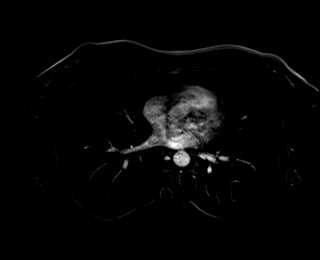

[Series 22: T1 dynamic fat-sat post-contrast · axial · 3.0mm · 1.19mm/px · z∈[-75,+138]mm · 3 of 72 slices shown (3 of 4)]
[im 1/72]
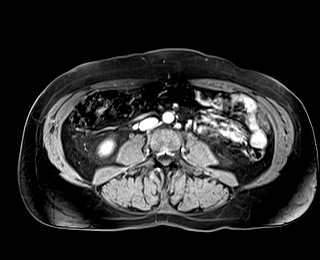
[im 36/72]
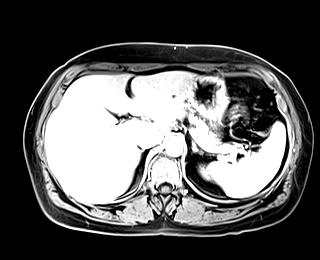
[im 72/72]
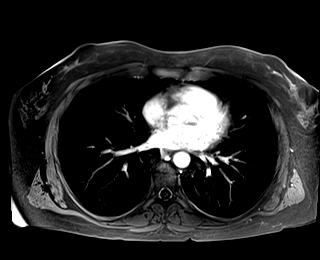

[Series 23: T1 dynamic fat-sat · axial · 3.0mm · 1.19mm/px · z∈[-75,+138]mm · 3 of 72 slices shown (4 of 5)]
[im 1/72]
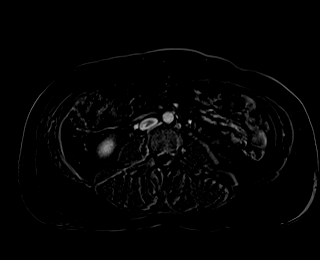
[im 36/72]
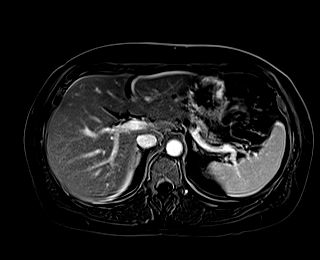
[im 72/72]
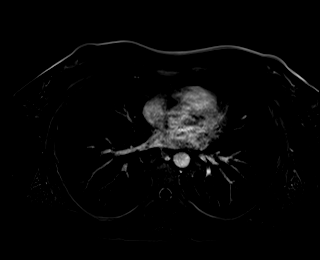

[Series 24: T1 dynamic post-contrast · coronal · 3.0mm · 1.31mm/px · 3 of 72 slices shown]
[im 1/72]
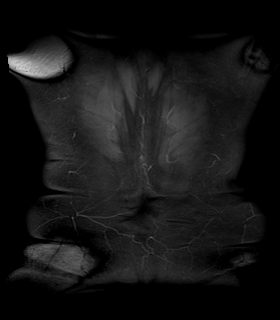
[im 36/72]
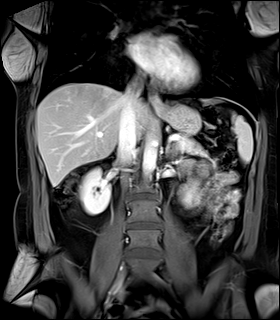
[im 72/72]
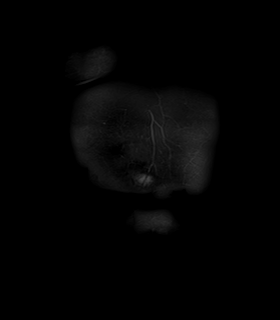

[Series 25: T1 dynamic fat-sat post-contrast · axial · 3.0mm · 1.19mm/px · z∈[-75,+138]mm · 3 of 72 slices shown (4 of 4)]
[im 1/72]
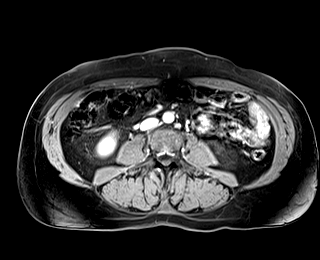
[im 36/72]
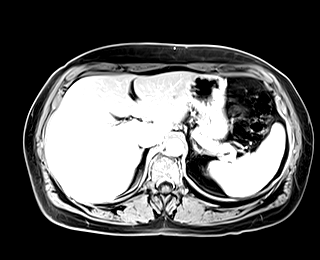
[im 72/72]
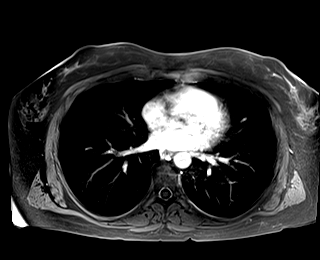

[Series 26: T1 dynamic fat-sat · axial · 3.0mm · 1.19mm/px · z∈[-75,+138]mm · 3 of 72 slices shown (5 of 5)]
[im 1/72]
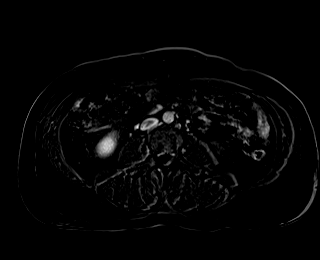
[im 36/72]
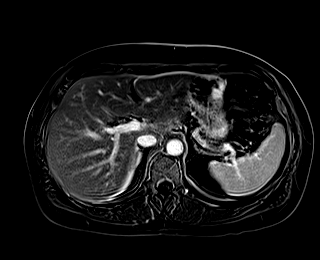
[im 72/72]
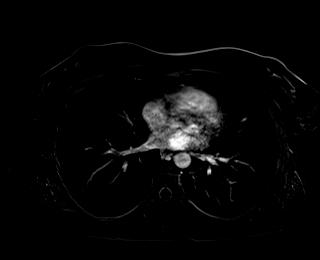

[44 of 48 positions shown; findings below may reference images not displayed]

FINDINGS: Lower chest: Unremarkable

Hepatobiliary: There are approximately 8 tiny cysts in the liver.
Gallbladder unremarkable. No biliary dilatation. No filling defect
or irregularity of the common bile duct. Intrahepatic biliary tree
unremarkable.

Pancreas: Normal morphology of the pancreas without pancreas
divisum. No findings of pancreatic necrosis, abscess, or pseudocyst.
The previous inflammatory stranding around the pancreatic head shown
on 07/11/2018 appears to have resolved. No appreciable pancreatic
mass.

Spleen:  Unremarkable

Adrenals/Urinary Tract:  Unremarkable

Stomach/Bowel: Unremarkable

Vascular/Lymphatic:  Unremarkable

Other:  No supplemental non-categorized findings.

Musculoskeletal: Unremarkable
IMPRESSION: 1. Prior pancreatitis has resolved. No findings of pancreatic
abscess, pseudocyst, or pancreatic necrosis.
2. Approximately 8 tiny cysts are present in the liver.

## 2019-02-26 ENCOUNTER — Other Ambulatory Visit: Payer: Self-pay

## 2019-02-26 ENCOUNTER — Telehealth: Payer: Self-pay

## 2019-02-26 DIAGNOSIS — K7689 Other specified diseases of liver: Secondary | ICD-10-CM

## 2019-02-26 NOTE — Telephone Encounter (Signed)
Spoke with pt and informed her that she is due for a 6 month follow up ultrasound of the liver as recommended by Dr. Vicente Males after pt MRCP back in January 2020. Pt agrees to proceed with scheduling.

## 2019-02-26 NOTE — Telephone Encounter (Signed)
-----   Message from [place] sent at 09/17/2018  3:14 PM EST ----- Pt needs repeat US liver per MRI results in January.  "Tabitha Carr inform MRI shows pancreatitis has resolved- no other structural abnormality in the pancreas to cause pancreatitis. Suggest D/c all alcohol. 8 tiny cysts in the liver- repeat USG liver in 6 months"  I gave this pt her results while you were out on your birthday celebration.

## 2019-03-04 ENCOUNTER — Ambulatory Visit: Payer: 59 | Admitting: Gastroenterology

## 2019-03-12 ENCOUNTER — Encounter: Payer: Self-pay | Admitting: Gastroenterology

## 2019-03-12 ENCOUNTER — Ambulatory Visit (INDEPENDENT_AMBULATORY_CARE_PROVIDER_SITE_OTHER): Payer: 59 | Admitting: Gastroenterology

## 2019-03-12 ENCOUNTER — Other Ambulatory Visit: Payer: Self-pay

## 2019-03-12 VITALS — BP 132/85 | HR 106 | Temp 98.8°F | Ht 63.0 in | Wt 166.6 lb

## 2019-03-12 DIAGNOSIS — K852 Alcohol induced acute pancreatitis without necrosis or infection: Secondary | ICD-10-CM

## 2019-03-12 NOTE — Progress Notes (Signed)
Primary Care Physician: Marguarite ArbourSparks, Jeffrey D, MD  Primary Gastroenterologist:  Dr. Midge Miniumarren Bryant Saye  Chief Complaint  Patient presents with  . Follow up pancreatitis    HPI: Tabitha Carr is a 48 y.o. female here for history of pancreatitis.  The [facility] for fatty liver likely due to alcohol with abnormal liver enzymes.  The patient had then been followed by Dr. Tobi BastosAnna with an admission to the hospital for pancreatitis.  The patient had an MRCP that did not show any pancreatic divisum or any abnormalities the pancreas to explain her pancreatitis.  The patient has a history of alcohol use and abuse and has admitted to cutting down in the past.  The patient was last seen in December 2019 by Dr. Tobi BastosAnna and had requested to switch her care to me.  The patient reports this is because I took care of her daughter and she liked dealing with me.  The patient had previously been diagnosed with significant anemia but her most recent labs in January at [facility] showed normal liver enzymes and normal CBC.  The lipase in January also came down to 54.  The MRI findings are below:  IMPRESSION: 1. Prior pancreatitis has resolved. No findings of pancreatic abscess, pseudocyst, or pancreatic necrosis. 2. Approximately 8 tiny cysts are present in the liver.  The patient reports that she has been doing well without any abdominal pain unexplained weight loss steatorrhea nausea or vomiting.  The patient has gone to nonalcoholic drinks.  The patient has had multiple imaging of her liver with a CT scan showing the subcentimeter cyst in her liver back in 2016 without any enlargement on her most recent MRI and CT scan.  Current Outpatient Medications  Medication Sig Dispense Refill  . Cholecalciferol (VITAMIN D3) 125 MCG (5000 UT) TABS Take 5,000 Units by mouth at bedtime.    Marland Kitchen. HYDROcodone-homatropine (HYCODAN) 5-1.5 MG/5ML syrup Take 5 mLs by mouth every 6 (six) hours as needed  for cough.     . pantoprazole (PROTONIX) 40 MG tablet Take 40 mg by mouth at bedtime.    . phentermine (ADIPEX-P) 37.5 MG tablet Take 37.5 mg by mouth daily before breakfast.    . zolpidem (AMBIEN) 10 MG tablet Take 10 mg by mouth at bedtime.     No current facility-administered medications for this visit.     Allergies as of 03/12/2019 - Review Complete 03/12/2019  Allergen Reaction Noted  . Latex Other (See Comments) 07/31/2018  . Meloxicam Swelling 04/28/2016    ROS:  General: Negative for anorexia, weight loss, fever, chills, fatigue, weakness. ENT: Negative for hoarseness, difficulty swallowing , nasal congestion. CV: Negative for chest pain, angina, palpitations, dyspnea on exertion, peripheral edema.  Respiratory: Negative for dyspnea at rest, dyspnea on exertion, cough, sputum, wheezing.  GI: See history of present illness. GU:  Negative for dysuria, hematuria, urinary incontinence, urinary frequency, nocturnal urination.  Endo: Negative for unusual weight change.    Physical Examination:   BP 132/85   Pulse (!) 106   Temp 98.8 F (37.1 C) (Oral)   Ht 5\' 3"  (1.6 m)   Wt 166 lb 9.6 oz (75.6 kg)   BMI 29.51 kg/m   General: Well-nourished, well-developed in no acute distress.  Eyes: No icterus. Conjunctivae pink. Mouth: Oropharyngeal mucosa moist and pink , no lesions erythema or exudate. Lungs: Clear to auscultation bilaterally. Non-labored. Heart: Regular rate and rhythm, no murmurs rubs or gallops.  Abdomen: Bowel sounds are normal, nontender, nondistended, no hepatosplenomegaly or masses, no abdominal bruits or hernia , no rebound or guarding.   Extremities: No lower extremity edema. No clubbing or deformities. Neuro: Alert and oriented x 3.  Grossly intact. Skin: Warm and dry, no jaundice.   Psych: Alert and cooperative, normal mood and affect.  Labs:    Imaging Studies: No results found.  Tabitha Carr is a 48 y.o. y/o female  who comes in today with a history of recurrent pancreatitis with a history of alcohol use related to the pancreatitis.  The patient has been doing well without any issues at the present time.  The patient is concerned about whether she needs any further labs.  Since the patient is asymptomatic and having no issues I have told her that there is no need to send off any labs at this time.  She was also set up for a ultrasound of the liver to evaluate the cyst that were seen on the MRI and CT scan in the past.  The patient has been told that the ultrasound is unlikely to add any more information to these tiny subcentimeter cysts that had not grown in size over 4 years.  I have told her that I believe no further follow-up of the cyst is needed.  The patient will follow-up with me as needed.  She has been explained the plan and agrees with it.    Tabitha Lame, MD. Marval Regal   Note: This dictation was prepared with Dragon dictation along with smaller phrase technology. Any transcriptional errors that result from this process are unintentional.

## 2019-03-18 ENCOUNTER — Ambulatory Visit: Payer: 59

## 2019-03-21 ENCOUNTER — Ambulatory Visit: Payer: 59

## 2019-04-25 ENCOUNTER — Other Ambulatory Visit: Payer: Self-pay | Admitting: *Deleted

## 2019-04-25 DIAGNOSIS — Z20822 Contact with and (suspected) exposure to covid-19: Secondary | ICD-10-CM

## 2019-04-27 LAB — NOVEL CORONAVIRUS, NAA: SARS-CoV-2, NAA: NOT DETECTED

## 2019-05-08 ENCOUNTER — Other Ambulatory Visit: Payer: Self-pay | Admitting: Sports Medicine

## 2019-05-08 DIAGNOSIS — M25559 Pain in unspecified hip: Secondary | ICD-10-CM

## 2019-05-21 ENCOUNTER — Ambulatory Visit: Payer: Self-pay

## 2019-07-25 ENCOUNTER — Other Ambulatory Visit: Payer: Self-pay

## 2019-07-25 DIAGNOSIS — Z20822 Contact with and (suspected) exposure to covid-19: Secondary | ICD-10-CM

## 2019-07-28 LAB — NOVEL CORONAVIRUS, NAA: SARS-CoV-2, NAA: NOT DETECTED

## 2019-12-18 ENCOUNTER — Other Ambulatory Visit: Payer: Self-pay

## 2019-12-18 ENCOUNTER — Ambulatory Visit: Payer: Self-pay | Attending: Internal Medicine

## 2019-12-18 DIAGNOSIS — Z23 Encounter for immunization: Secondary | ICD-10-CM

## 2019-12-18 NOTE — Progress Notes (Signed)
   Covid-19 Vaccination Clinic  Name:  Tabitha Carr    MRN: [id number] DOB: [birth year]  12/18/2019  [name] was observed post Covid-19 immunization for 15 minutes without incident. She was provided with Vaccine Information Sheet and instruction to access the V-Safe system.   [name] was instructed to call 911 with any severe reactions post vaccine: Marland Kitchen Difficulty breathing  . Swelling of face and throat  . A fast heartbeat  . A bad rash all over body  . Dizziness and weakness   Immunizations Administered    Name Date Dose VIS Date Route   Pfizer COVID-19 Vaccine 12/18/2019 11:23 AM 0.3 mL 10/15/2018 Intramuscular   Manufacturer: [employer], [employer]   Lot: [id number]   NDC: [address]-4

## 2020-01-10 ENCOUNTER — Other Ambulatory Visit: Payer: Self-pay

## 2020-01-10 DIAGNOSIS — E860 Dehydration: Secondary | ICD-10-CM | POA: Insufficient documentation

## 2020-01-10 DIAGNOSIS — Z79899 Other long term (current) drug therapy: Secondary | ICD-10-CM | POA: Insufficient documentation

## 2020-01-10 DIAGNOSIS — R002 Palpitations: Secondary | ICD-10-CM | POA: Diagnosis not present

## 2020-01-10 LAB — CBC WITH DIFFERENTIAL/PLATELET
Abs Immature Granulocytes: 0.03 10*3/uL (ref 0.00–0.07)
Basophils Absolute: 0.1 10*3/uL (ref 0.0–0.1)
Basophils Relative: 2 %
Eosinophils Absolute: 0.5 10*3/uL (ref 0.0–0.5)
Eosinophils Relative: 7 %
HCT: 35.8 % — ABNORMAL LOW (ref 36.0–46.0)
Hemoglobin: 13.2 g/dL (ref 12.0–15.0)
Immature Granulocytes: 0 %
Lymphocytes Relative: 28 %
Lymphs Abs: 2.2 10*3/uL (ref 0.7–4.0)
MCH: 33.1 pg (ref 26.0–34.0)
MCHC: 36.9 g/dL — ABNORMAL HIGH (ref 30.0–36.0)
MCV: 89.7 fL (ref 80.0–100.0)
Monocytes Absolute: 0.7 10*3/uL (ref 0.1–1.0)
Monocytes Relative: 9 %
Neutro Abs: 4.4 10*3/uL (ref 1.7–7.7)
Neutrophils Relative %: 54 %
Platelets: 355 10*3/uL (ref 150–400)
RBC: 3.99 MIL/uL (ref 3.87–5.11)
RDW: 12.2 % (ref 11.5–15.5)
WBC: 8 10*3/uL (ref 4.0–10.5)
nRBC: 0 % (ref 0.0–0.2)

## 2020-01-10 LAB — COMPREHENSIVE METABOLIC PANEL
ALT: 33 U/L (ref 0–44)
AST: 35 U/L (ref 15–41)
Albumin: 4.4 g/dL (ref 3.5–5.0)
Alkaline Phosphatase: 87 U/L (ref 38–126)
Anion gap: 12 (ref 5–15)
BUN: 8 mg/dL (ref 6–20)
CO2: 22 mmol/L (ref 22–32)
Calcium: 9.1 mg/dL (ref 8.9–10.3)
Chloride: 96 mmol/L — ABNORMAL LOW (ref 98–111)
Creatinine, Ser: 0.76 mg/dL (ref 0.44–1.00)
GFR calc Af Amer: >60 mL/min (ref >60)
GFR calc non Af Amer: >60 mL/min (ref >60)
Glucose, Bld: 87 mg/dL (ref 70–99)
Potassium: 3.7 mmol/L (ref 3.5–5.1)
Sodium: 130 mmol/L — ABNORMAL LOW (ref 135–145)
Total Bilirubin: 0.6 mg/dL (ref 0.3–1.2)
Total Protein: 7.5 g/dL (ref 6.5–8.1)

## 2020-01-10 NOTE — ED Triage Notes (Signed)
Patient reports feeling like her heart racing off/on since approximately 4 pm.  Patient also stating she feels dehydrated.

## 2020-01-11 ENCOUNTER — Emergency Department
Admission: EM | Admit: 2020-01-11 | Discharge: 2020-01-11 | Disposition: A | Discharge status: Home / Self Care | Payer: 59 | Attending: Emergency Medicine | Admitting: Emergency Medicine

## 2020-01-11 ENCOUNTER — Emergency Department: Payer: 59

## 2020-01-11 DIAGNOSIS — E860 Dehydration: Secondary | ICD-10-CM

## 2020-01-11 DIAGNOSIS — R002 Palpitations: Secondary | ICD-10-CM

## 2020-01-11 LAB — URINALYSIS, ROUTINE W REFLEX MICROSCOPIC
Bilirubin Urine: NEGATIVE
Glucose, UA: NEGATIVE mg/dL
Hgb urine dipstick: NEGATIVE
Ketones, ur: 5 mg/dL — AB
Leukocytes,Ua: NEGATIVE
Nitrite: NEGATIVE
Protein, ur: NEGATIVE mg/dL
Specific Gravity, Urine: 1.006 (ref 1.005–1.030)
pH: 5 (ref 5.0–8.0)

## 2020-01-11 LAB — T4, FREE: Free T4: 0.61 ng/dL (ref 0.61–1.12)

## 2020-01-11 LAB — TSH: TSH: 3.737 u[IU]/mL (ref 0.350–4.500)

## 2020-01-11 LAB — POCT PREGNANCY, URINE: Preg Test, Ur: NEGATIVE

## 2020-01-11 LAB — TROPONIN I (HIGH SENSITIVITY): Troponin I (High Sensitivity): <2 ng/L (ref <18)

## 2020-01-11 MED ORDER — SODIUM CHLORIDE 0.9 % IV BOLUS
1000.0000 mL | Freq: Once | INTRAVENOUS | Status: AC
  Administered 2020-01-11: 1000 mL via INTRAVENOUS

## 2020-01-11 NOTE — Discharge Instructions (Addendum)
1.  Drink plenty of fluids daily. 2.  Decrease caffeine whenever possible. 3.  Return to the ER for worsening symptoms, persistent vomiting, difficulty breathing or other concerns.

## 2020-01-11 NOTE — ED Provider Notes (Signed)
[facility] Emergency Department Provider Note   ____________________________________________   First MD Initiated Contact with Patient 01/11/20 0215     (approximate)  I have reviewed the triage vital signs and the nursing notes.   HISTORY  Chief Complaint Tachycardia and Fatigue    HPI Tabitha Carr is a 49 y.o. female who presents to the ED from home with a chief complaint of palpitations.  Patient reports feeling palpitations since approximately 4 PM.  Also feeling fatigued and dehydrated.  Denies heart skipping beats.  Denies fever, chills, cough, chest pain, shortness of breath, abdominal pain, nausea, vomiting, dysuria or diarrhea.  Denies recent travel, trauma or hormone use.  Drinks 1/4 soda daily.       Past Medical History:  Diagnosis Date  . Abnormal LFTs   . Adult ADHD   . Depression   . Insomnia   . Irritable bowel syndrome with both constipation and diarrhea   . Migraines   . Vitamin D deficiency     Patient Active Problem List   Diagnosis Date Noted  . Fatty liver 10/02/2018  . Acute pancreatitis 07/11/2018  . Vitamin D deficiency 09/26/2017  . Abnormal LFTs (liver function tests) 04/26/2017  . Chronic insomnia 10/18/2016  . Irritable bowel syndrome with both constipation and diarrhea 10/18/2016  . ADHD (attention deficit hyperactivity disorder) 07/07/2015  . Depression 07/07/2015  . Migraines 07/07/2015  . Adult ADHD 06/15/2015    Past Surgical History:  Procedure Laterality Date  . HERNIA REPAIR    . TUBAL LIGATION      Prior to Admission medications   Medication Sig Start Date End Date Taking? Authorizing Provider  azithromycin (ZITHROMAX) 500 MG tablet azithromycin 500 mg tablet  Take 1 tablet every day by oral route for 5 days.    [provider]  Cholecalciferol (VITAMIN D3) 125 MCG (5000 UT) TABS Take 5,000 Units by mouth at bedtime.    [provider]  HYDROcodone-homatropine (HYCODAN) 5-1.5  MG/5ML syrup Take 5 mLs by mouth every 6 (six) hours as needed for cough.     [provider]  pantoprazole (PROTONIX) 40 MG tablet Take 40 mg by mouth at bedtime.    [provider]  phentermine (ADIPEX-P) 37.5 MG tablet Take 37.5 mg by mouth daily before breakfast.    [provider]  zolpidem (AMBIEN) 10 MG tablet Take 10 mg by mouth at bedtime.    [provider]    Allergies Latex and Meloxicam  No family history on file.  Social History Social History   Tobacco Use  . Smoking status: Never Smoker  . Smokeless tobacco: Never Used  Substance Use Topics  . Alcohol use: Yes  . Drug use: Never    Review of Systems  Constitutional: Positive for fatigue.  No fever/chills Eyes: No visual changes. ENT: No sore throat. Cardiovascular: Positive for palpitations.  Denies chest pain. Respiratory: Denies shortness of breath. Gastrointestinal: No abdominal pain.  No nausea, no vomiting.  No diarrhea.  No constipation. Genitourinary: Negative for dysuria. Musculoskeletal: Negative for back pain. Skin: Negative for rash. Neurological: Negative for headaches, focal weakness or numbness.   ____________________________________________   PHYSICAL EXAM:  VITAL SIGNS: ED Triage Vitals [01/10/20 2219]  Enc Vitals Group     BP (!) 146/84     Pulse Rate 97     Resp 18     Temp 97.8 F (36.6 C)     Temp Source Oral  SpO2 100 %     Weight 175 lb (79.4 kg)     Height 5\' 3"  (1.6 m)     Head Circumference      Peak Flow      Pain Score 0     Pain Loc      Pain Edu?      Excl. in GC?     Constitutional: Alert and oriented.  Tired appearing and in no acute distress. Eyes: Conjunctivae are normal. PERRL. EOMI. Head: Atraumatic. Nose: No congestion/rhinnorhea. Mouth/Throat: Mucous membranes are mildly dry.  Oropharynx non-erythematous. Neck: No stridor.  No thyromegaly. Cardiovascular: Normal rate, regular rhythm. Grossly normal heart  sounds.  Good peripheral circulation. Respiratory: Normal respiratory effort.  No retractions. Lungs CTAB. Gastrointestinal: Soft and nontender to light or deep palpation. No distention. No abdominal bruits. No CVA tenderness. Musculoskeletal: No lower extremity tenderness nor edema.  No joint effusions. Neurologic:  Normal speech and language. No gross focal neurologic deficits are appreciated. No gait instability. Skin:  Skin is warm, dry and intact. No rash noted. Psychiatric: Mood and affect are normal. Speech and behavior are normal.  ____________________________________________   LABS (all labs ordered are listed, but only abnormal results are displayed)  Labs Reviewed  CBC WITH DIFFERENTIAL/PLATELET - Abnormal; Notable for the following components:      Result Value   HCT 35.8 (*)    MCHC 36.9 (*)    All other components within normal limits  COMPREHENSIVE METABOLIC PANEL - Abnormal; Notable for the following components:   Sodium 130 (*)    Chloride 96 (*)    All other components within normal limits  URINALYSIS, ROUTINE W REFLEX MICROSCOPIC - Abnormal; Notable for the following components:   Color, Urine STRAW (*)    APPearance CLEAR (*)    Ketones, ur 5 (*)    All other components within normal limits  TSH  T4, FREE  POC URINE PREG, ED  POCT PREGNANCY, URINE  TROPONIN I (HIGH SENSITIVITY)   ____________________________________________  EKG  ED ECG REPORT I, Marice Guidone J, the attending physician, personally viewed and interpreted this ECG.   Date: 01/11/2020  EKG Time: 2222  Rate: 92  Rhythm: normal EKG, normal sinus rhythm  Axis: Normal  Intervals:none  ST&T Change: Nonspecific  ____________________________________________  RADIOLOGY  ED MD interpretation: No acute cardiopulmonary process  Official radiology report(s): DG Chest Port 1 View  Result Date: 01/11/2020 CLINICAL DATA:  Tachycardia EXAM: PORTABLE CHEST 1 VIEW COMPARISON:  01/26/2011  FINDINGS: Lungs are clear.  No pleural effusion or pneumothorax. The heart is normal in size. IMPRESSION: No evidence of acute cardiopulmonary disease. Electronically Signed   By: 03/28/2011 M.D.   On: 01/11/2020 04:06    ____________________________________________   PROCEDURES  Procedure(s) performed (including Critical Care):  .1-3 Lead EKG Interpretation Performed by: 01/13/2020, MD Authorized by: Irean Hong, MD     Interpretation: normal     ECG rate:  90   ECG rate assessment: normal     Rhythm: sinus rhythm     Ectopy: none     Conduction: normal   Comments:     Patient placed on cardiac monitor to monitor for arrhythmias     ____________________________________________   INITIAL IMPRESSION / ASSESSMENT AND PLAN / ED COURSE  As part of my medical decision making, I reviewed the following data within the electronic MEDICAL RECORD NUMBER Nursing notes reviewed and incorporated, Labs reviewed, EKG interpreted, Old chart reviewed, Radiograph reviewed and  Notes from prior ED visits     Tabitha Carr was evaluated in Emergency Department on 01/11/2020 for the symptoms described in the history of present illness. She was evaluated in the context of the global COVID-19 pandemic, which necessitated consideration that the patient might be at risk for infection with the SARS-CoV-2 virus that causes COVID-19. Institutional protocols and algorithms that pertain to the evaluation of patients at risk for COVID-19 are in a state of rapid change based on information released by regulatory bodies including the CDC and federal and state organizations. These policies and algorithms were followed during the patient's care in the ED.    49 year old female who presents to the ED with fatigue, palpitations, dehydration.  Differential diagnosis includes but is not limited to infectious, metabolic, endocrinologic etiologies, etc.  Laboratory results unremarkable except for mild hyponatremia.   Do not feel repeat troponin necessary given that patient is not experiencing chest pain.  Awaiting urine specimen.  Will initiate IV fluid hydration and reassess.   Clinical Course as of Jan 11 532  Sun Jan 11, 2020  0502 Patient feeling better after IV fluids.  Updated patient and spouse on rest of lab work and urinalysis.  Will refer to outpatient cardiology for follow-up.  Strict return precautions given.  Both verbalized understanding and agree with plan of care.   [JS]    Clinical Course User Index [JS] Paulette Blanch, MD     ____________________________________________   FINAL CLINICAL IMPRESSION(S) / ED DIAGNOSES  Final diagnoses:  Palpitations  Dehydration     ED Discharge Orders    None       Note:  This document was prepared using Dragon voice recognition software and may include unintentional dictation errors.   Paulette Blanch, MD 01/11/20 [phone]

## 2020-01-13 ENCOUNTER — Ambulatory Visit: Payer: 59 | Attending: Internal Medicine

## 2020-01-13 DIAGNOSIS — Z23 Encounter for immunization: Secondary | ICD-10-CM

## 2020-01-13 NOTE — Progress Notes (Signed)
   Covid-19 Vaccination Clinic  Name:  Tabitha Carr    MRN: [id number] DOB: [birth year]  01/13/2020  Tabitha Carr was observed post Covid-19 immunization for 15 minutes without incident. She was provided with Vaccine Information Sheet and instruction to access the V-Safe system.   Tabitha Carr was instructed to call 911 with any severe reactions post vaccine: Marland Kitchen Difficulty breathing  . Swelling of face and throat  . A fast heartbeat  . A bad rash all over body  . Dizziness and weakness   Immunizations Administered    Name Date Dose VIS Date Route   Pfizer COVID-19 Vaccine 01/13/2020  8:06 AM 0.3 mL 10/15/2018 Intramuscular   Manufacturer: [employer], [employer]   Lot: [id number]   NDC: [address]-9

## 2021-03-11 ENCOUNTER — Emergency Department: Payer: BC Managed Care – PPO

## 2021-03-11 ENCOUNTER — Encounter: Payer: Self-pay | Admitting: Emergency Medicine

## 2021-03-11 ENCOUNTER — Other Ambulatory Visit: Payer: Self-pay

## 2021-03-11 ENCOUNTER — Inpatient Hospital Stay
Admission: EM | Admit: 2021-03-11 | Discharge: 2021-03-12 | DRG: 392 | Disposition: A | Discharge status: Home / Self Care | Payer: BC Managed Care – PPO | Attending: Hospitalist | Admitting: Hospitalist

## 2021-03-11 DIAGNOSIS — K529 Noninfective gastroenteritis and colitis, unspecified: Secondary | ICD-10-CM

## 2021-03-11 DIAGNOSIS — K635 Polyp of colon: Secondary | ICD-10-CM | POA: Diagnosis present

## 2021-03-11 DIAGNOSIS — I10 Essential (primary) hypertension: Secondary | ICD-10-CM | POA: Diagnosis present

## 2021-03-11 DIAGNOSIS — Z66 Do not resuscitate: Secondary | ICD-10-CM | POA: Diagnosis present

## 2021-03-11 DIAGNOSIS — Z79899 Other long term (current) drug therapy: Secondary | ICD-10-CM | POA: Diagnosis not present

## 2021-03-11 DIAGNOSIS — A419 Sepsis, unspecified organism: Secondary | ICD-10-CM | POA: Diagnosis present

## 2021-03-11 DIAGNOSIS — K760 Fatty (change of) liver, not elsewhere classified: Secondary | ICD-10-CM | POA: Diagnosis present

## 2021-03-11 DIAGNOSIS — F909 Attention-deficit hyperactivity disorder, unspecified type: Secondary | ICD-10-CM | POA: Diagnosis present

## 2021-03-11 DIAGNOSIS — D75839 Thrombocytosis, unspecified: Secondary | ICD-10-CM | POA: Diagnosis present

## 2021-03-11 DIAGNOSIS — T783XXA Angioneurotic edema, initial encounter: Secondary | ICD-10-CM | POA: Diagnosis present

## 2021-03-11 DIAGNOSIS — N179 Acute kidney failure, unspecified: Secondary | ICD-10-CM | POA: Diagnosis present

## 2021-03-11 DIAGNOSIS — G4700 Insomnia, unspecified: Secondary | ICD-10-CM | POA: Diagnosis present

## 2021-03-11 DIAGNOSIS — Z6833 Body mass index (BMI) 33.0-33.9, adult: Secondary | ICD-10-CM | POA: Diagnosis not present

## 2021-03-11 DIAGNOSIS — E559 Vitamin D deficiency, unspecified: Secondary | ICD-10-CM | POA: Diagnosis present

## 2021-03-11 DIAGNOSIS — E669 Obesity, unspecified: Secondary | ICD-10-CM | POA: Diagnosis present

## 2021-03-11 DIAGNOSIS — F32A Depression, unspecified: Secondary | ICD-10-CM | POA: Diagnosis present

## 2021-03-11 DIAGNOSIS — K589 Irritable bowel syndrome without diarrhea: Secondary | ICD-10-CM | POA: Diagnosis present

## 2021-03-11 DIAGNOSIS — K648 Other hemorrhoids: Secondary | ICD-10-CM | POA: Diagnosis present

## 2021-03-11 DIAGNOSIS — Z888 Allergy status to other drugs, medicaments and biological substances status: Secondary | ICD-10-CM | POA: Diagnosis not present

## 2021-03-11 DIAGNOSIS — I340 Nonrheumatic mitral (valve) insufficiency: Secondary | ICD-10-CM | POA: Diagnosis present

## 2021-03-11 DIAGNOSIS — A059 Bacterial foodborne intoxication, unspecified: Secondary | ICD-10-CM | POA: Diagnosis present

## 2021-03-11 DIAGNOSIS — X58XXXA Exposure to other specified factors, initial encounter: Secondary | ICD-10-CM | POA: Diagnosis present

## 2021-03-11 DIAGNOSIS — R6520 Severe sepsis without septic shock: Secondary | ICD-10-CM | POA: Diagnosis not present

## 2021-03-11 DIAGNOSIS — Z91040 Latex allergy status: Secondary | ICD-10-CM | POA: Diagnosis not present

## 2021-03-11 DIAGNOSIS — Z20822 Contact with and (suspected) exposure to covid-19: Secondary | ICD-10-CM | POA: Diagnosis present

## 2021-03-11 DIAGNOSIS — K582 Mixed irritable bowel syndrome: Secondary | ICD-10-CM | POA: Diagnosis present

## 2021-03-11 LAB — C DIFFICILE QUICK SCREEN W PCR REFLEX
C Diff antigen: NEGATIVE
C Diff interpretation: NOT DETECTED
C Diff toxin: NEGATIVE

## 2021-03-11 LAB — URINALYSIS, COMPLETE (UACMP) WITH MICROSCOPIC
Bilirubin Urine: NEGATIVE
Glucose, UA: NEGATIVE mg/dL
Hgb urine dipstick: NEGATIVE
Ketones, ur: 5 mg/dL — AB
Leukocytes,Ua: NEGATIVE
Nitrite: NEGATIVE
Protein, ur: 30 mg/dL — AB
Specific Gravity, Urine: 1.021 (ref 1.005–1.030)
pH: 5 (ref 5.0–8.0)

## 2021-03-11 LAB — LACTIC ACID, PLASMA
Lactic Acid, Venous: 1.9 mmol/L (ref 0.5–1.9)
Lactic Acid, Venous: 2.2 mmol/L (ref 0.5–1.9)

## 2021-03-11 LAB — GASTROINTESTINAL PANEL BY PCR, STOOL (REPLACES STOOL CULTURE)

## 2021-03-11 LAB — COMPREHENSIVE METABOLIC PANEL
ALT: 32 U/L (ref 0–44)
AST: 41 U/L (ref 15–41)
Albumin: 3.5 g/dL (ref 3.5–5.0)
Alkaline Phosphatase: 88 U/L (ref 38–126)
Anion gap: 12 (ref 5–15)
BUN: 8 mg/dL (ref 6–20)
CO2: 24 mmol/L (ref 22–32)
Calcium: 8.9 mg/dL (ref 8.9–10.3)
Chloride: 97 mmol/L — ABNORMAL LOW (ref 98–111)
Creatinine, Ser: 1.09 mg/dL — ABNORMAL HIGH (ref 0.44–1.00)
GFR, Estimated: >60 mL/min (ref >60)
Glucose, Bld: 124 mg/dL — ABNORMAL HIGH (ref 70–99)
Potassium: 4 mmol/L (ref 3.5–5.1)
Sodium: 133 mmol/L — ABNORMAL LOW (ref 135–145)
Total Bilirubin: 1.1 mg/dL (ref 0.3–1.2)
Total Protein: 7.2 g/dL (ref 6.5–8.1)

## 2021-03-11 LAB — CBC
HCT: 44.6 % (ref 36.0–46.0)
Hemoglobin: 15.8 g/dL — ABNORMAL HIGH (ref 12.0–15.0)
MCH: 34.7 pg — ABNORMAL HIGH (ref 26.0–34.0)
MCHC: 35.4 g/dL (ref 30.0–36.0)
MCV: 98 fL (ref 80.0–100.0)
Platelets: 486 10*3/uL — ABNORMAL HIGH (ref 150–400)
RBC: 4.55 MIL/uL (ref 3.87–5.11)
RDW: 12.5 % (ref 11.5–15.5)
WBC: 24.2 10*3/uL — ABNORMAL HIGH (ref 4.0–10.5)
nRBC: 0 % (ref 0.0–0.2)

## 2021-03-11 LAB — POC URINE PREG, ED: Preg Test, Ur: NEGATIVE

## 2021-03-11 LAB — RESP PANEL BY RT-PCR (FLU A&B, COVID) ARPGX2
Influenza A by PCR: NEGATIVE
Influenza B by PCR: NEGATIVE
SARS Coronavirus 2 by RT PCR: NEGATIVE

## 2021-03-11 LAB — LIPASE, BLOOD: Lipase: 54 U/L — ABNORMAL HIGH (ref 11–51)

## 2021-03-11 MED ORDER — LACTATED RINGERS IV SOLN: INTRAVENOUS | Status: AC

## 2021-03-11 MED ORDER — HYDROCODONE-ACETAMINOPHEN 5-325 MG PO TABS: 1.0000 | ORAL_TABLET | ORAL | Status: DC | PRN

## 2021-03-11 MED ORDER — METOCLOPRAMIDE HCL 5 MG/ML IJ SOLN
10.0000 mg | Freq: Once | INTRAMUSCULAR | Status: AC
  Administered 2021-03-11: 10 mg via INTRAVENOUS
  Filled 2021-03-11: qty 2

## 2021-03-11 MED ORDER — SODIUM CHLORIDE 0.9 % IV SOLN
2.0000 g | INTRAVENOUS | Status: DC
  Administered 2021-03-11: 2 g via INTRAVENOUS
  Filled 2021-03-11: qty 20

## 2021-03-11 MED ORDER — ACETAMINOPHEN 325 MG PO TABS: 650.0000 mg | ORAL_TABLET | Freq: Four times a day (QID) | ORAL | Status: DC | PRN

## 2021-03-11 MED ORDER — MORPHINE SULFATE (PF) 2 MG/ML IV SOLN
2.0000 mg | INTRAVENOUS | Status: AC | PRN
  Administered 2021-03-11 (×2): 2 mg via INTRAVENOUS
  Filled 2021-03-11 (×2): qty 1

## 2021-03-11 MED ORDER — SODIUM CHLORIDE 0.9 % IV BOLUS
1000.0000 mL | Freq: Once | INTRAVENOUS | Status: AC
  Administered 2021-03-11: 1000 mL via INTRAVENOUS

## 2021-03-11 MED ORDER — MORPHINE SULFATE (PF) 2 MG/ML IV SOLN
2.0000 mg | INTRAVENOUS | Status: DC | PRN
  Administered 2021-03-11 – 2021-03-12 (×3): 2 mg via INTRAVENOUS
  Filled 2021-03-11 (×3): qty 1

## 2021-03-11 MED ORDER — ONDANSETRON HCL 4 MG/2ML IJ SOLN: 4.0000 mg | Freq: Four times a day (QID) | INTRAMUSCULAR | Status: DC | PRN

## 2021-03-11 MED ORDER — METRONIDAZOLE 500 MG/100ML IV SOLN
500.0000 mg | Freq: Two times a day (BID) | INTRAVENOUS | Status: DC
  Administered 2021-03-11 – 2021-03-12 (×2): 500 mg via INTRAVENOUS
  Filled 2021-03-11 (×2): qty 100

## 2021-03-11 MED ORDER — ONDANSETRON HCL 4 MG PO TABS: 4.0000 mg | ORAL_TABLET | Freq: Four times a day (QID) | ORAL | Status: DC | PRN

## 2021-03-11 MED ORDER — ENOXAPARIN SODIUM 40 MG/0.4ML IJ SOSY
40.0000 mg | PREFILLED_SYRINGE | INTRAMUSCULAR | Status: DC
  Administered 2021-03-11: 40 mg via SUBCUTANEOUS
  Filled 2021-03-11: qty 0.4

## 2021-03-11 MED ORDER — LACTATED RINGERS IV BOLUS (SEPSIS)
2000.0000 mL | Freq: Once | INTRAVENOUS | Status: AC
  Administered 2021-03-11: 2000 mL via INTRAVENOUS

## 2021-03-11 MED ORDER — ACETAMINOPHEN 650 MG RE SUPP: 650.0000 mg | Freq: Four times a day (QID) | RECTAL | Status: DC | PRN

## 2021-03-11 NOTE — H&P (Signed)
History and Physical    Tabitha Carr DOB: May 24, 1971 DOA: 03/11/2021  PCP: Marguarite Arbour, MD   Patient coming from: Home  I have personally briefly reviewed patient's old medical records in [facility]  Chief Complaint: Abdominal pain, nausea vomiting  HPI: Tabitha Carr is a 50 y.o. female with medical history significant for HTN, depression, migraines, IBS, fatty liver  previously followed by GI,, nonrheumatic mitral valve regurgitation, presenting with a 1 day history of abdominal pain associated with nausea vomiting and diarrhea.  Abdominal pain is sharp and crampy in the mid and lower abdomen and of severe intensity with no aggravating or alleviating factors.  She had 1 episode of vomiting that was nonbilious and nonbloody associated with 3 loose bowel movement.  Denies offending foods and has no affected contacts.  She denied cough, chest pain or shortness of breath and has had no fevers.  Has concern symptoms might be related to an on new antihypertensive medication.  ED course: On arrival, afebrile, tacky at 107 with BP 88/55 respirations 22 with normal O2 sat on room air.  Blood work significant for leukocytosis of 24,200 and lactic acid 1.9-2.2.  CMP with creatinine of 1.09 up from baseline of 0.7 in June 2022.  LFTs WNL, lipase slightly elevated at 54.  Pregnancy test negative.  Urinalysis with many bacteria, 5 ketones otherwise normal.  COVID and flu negative.  EKG, personally viewed and interpreted: NSR at 100 with no acute ST-T wave changes  Imaging: Findings consistent with enteritis, likely infectious or inflammatory.  Possibility of bowel angioedema is raised.  Normal appendix.  Patient given an IV fluid bolus and stool studies ordered.  Hospitalist consulted for admission.  Review of Systems: As per HPI otherwise all other systems on review of systems negative.    Past Medical History:  Diagnosis Date   Abnormal LFTs    Adult ADHD     Depression    Insomnia    Irritable bowel syndrome with both constipation and diarrhea    Migraines    Vitamin D deficiency     Past Surgical History:  Procedure Laterality Date   HERNIA REPAIR     TUBAL LIGATION       reports that she has never smoked. She has never used smokeless tobacco. She reports current alcohol use. She reports that she does not use drugs.  Allergies  Allergen Reactions   Latex Other (See Comments)    Internal irritation    Meloxicam Swelling    History reviewed. No pertinent family history.    Prior to Admission medications   Medication Sig Start Date End Date Taking? Authorizing Provider  benazepril-hydrochlorthiazide (LOTENSIN HCT) 20-12.5 MG tablet Take 1 tablet by mouth daily. 03/10/21 03/10/22 Yes [provider]  Cholecalciferol (VITAMIN D3) 125 MCG (5000 UT) TABS Take 5,000 Units by mouth at bedtime.   Yes [provider]  Melatonin 10 MG TABS Take 10 mg by mouth at bedtime.   Yes [provider]  pantoprazole (PROTONIX) 40 MG tablet Take 40 mg by mouth at bedtime.   Yes [provider]  zolpidem (AMBIEN) 10 MG tablet Take 10 mg by mouth at bedtime.   Yes [provider]  phentermine (ADIPEX-P) 37.5 MG tablet Take 37.5 mg by mouth daily before breakfast. Patient not taking: Reported on 03/11/2021    [provider]    Physical Exam: Vitals:   03/11/21 1910 03/11/21 1921 03/11/21 1936 03/11/21 2000  BP:  109/69 100/65 95/65  Pulse: 95 97 85 88  Resp: 15 18  13   Temp:      SpO2: 98% 98% 98% 98%  Weight:      Height:         Vitals:   03/11/21 1910 03/11/21 1921 03/11/21 1936 03/11/21 2000  BP:  109/69 100/65 95/65  Pulse: 95 97 85 88  Resp: 15 18  13   Temp:      SpO2: 98% 98% 98% 98%  Weight:      Height:          Constitutional: Alert and oriented x 3 . Not in any apparent distress HEENT:      Head: Normocephalic and atraumatic.         Eyes: PERLA, EOMI, Conjunctivae  are normal. Sclera is non-icteric.       Mouth/Throat: Mucous membranes are moist.       Neck: Supple with no signs of meningismus. Cardiovascular: Regular rate and rhythm. No murmurs, gallops, or rubs. 2+ symmetrical distal pulses are present . No JVD. No LE edema Respiratory: Respiratory effort normal .Lungs sounds clear bilaterally. No wheezes, crackles, or rhonchi.  Gastrointestinal: Soft, tender on deep palpation in lower abdomen, non distended with positive bowel sounds.  Genitourinary: No CVA tenderness. Musculoskeletal: Nontender with normal range of motion in all extremities. No cyanosis, or erythema of extremities. Neurologic:  Face is symmetric. Moving all extremities. No gross focal neurologic deficits . Skin: Skin is warm, dry.  No rash or ulcers Psychiatric: Mood and affect are normal    Labs on Admission: I have personally reviewed following labs and imaging studies  CBC: Recent Labs  Lab 03/11/21 1602  WBC 24.2*  HGB 15.8*  HCT 44.6  MCV 98.0  PLT 486*   Basic Metabolic Panel: Recent Labs  Lab 03/11/21 1602  NA 133*  K 4.0  CL 97*  CO2 24  GLUCOSE 124*  BUN 8  CREATININE 1.09*  CALCIUM 8.9   GFR: Estimated Creatinine Clearance: 64.2 mL/min (A) (by C-G formula based on SCr of 1.09 mg/dL (H)). Liver Function Tests: Recent Labs  Lab 03/11/21 1602  AST 41  ALT 32  ALKPHOS 88  BILITOT 1.1  PROT 7.2  ALBUMIN 3.5   Recent Labs  Lab 03/11/21 1602  LIPASE 54*   No results for input(s): AMMONIA in the last 168 hours. Coagulation Profile: No results for input(s): INR, PROTIME in the last 168 hours. Cardiac Enzymes: No results for input(s): CKTOTAL, CKMB, CKMBINDEX, TROPONINI in the last 168 hours. BNP (last 3 results) No results for input(s): PROBNP in the last 8760 hours. HbA1C: No results for input(s): HGBA1C in the last 72 hours. CBG: No results for input(s): GLUCAP in the last 168 hours. Lipid Profile: No results for input(s): CHOL, HDL,  LDLCALC, TRIG, CHOLHDL, LDLDIRECT in the last 72 hours. Thyroid Function Tests: No results for input(s): TSH, T4TOTAL, FREET4, T3FREE, THYROIDAB in the last 72 hours. Anemia Panel: No results for input(s): VITAMINB12, FOLATE, FERRITIN, TIBC, IRON, RETICCTPCT in the last 72 hours. Urine analysis:    Component Value Date/Time   COLORURINE AMBER (A) 03/11/2021 1637   APPEARANCEUR CLOUDY (A) 03/11/2021 1637   LABSPEC 1.021 03/11/2021 1637   PHURINE 5.0 03/11/2021 1637   GLUCOSEU NEGATIVE 03/11/2021 1637   HGBUR NEGATIVE 03/11/2021 1637   BILIRUBINUR NEGATIVE 03/11/2021 1637   KETONESUR 5 (A) 03/11/2021 1637   PROTEINUR 30 (A) 03/11/2021 1637   NITRITE NEGATIVE 03/11/2021 1637   LEUKOCYTESUR  NEGATIVE 03/11/2021 1637    Radiological Exams on Admission: CT ABDOMEN PELVIS WO CONTRAST  Result Date: 03/11/2021 CLINICAL DATA:  RLQ abdominal pain, appendicitis suspected (Age >= 14y) Nausea and abdominal pain today. EXAM: CT ABDOMEN AND PELVIS WITHOUT CONTRAST TECHNIQUE: Multidetector CT imaging of the abdomen and pelvis was performed following the standard protocol without IV contrast. COMPARISON:  CT 07/11/2018, abdominal MRI 09/11/2018 FINDINGS: Lower chest: No acute airspace disease or pleural effusion. Hepatobiliary: Focal fatty infiltration adjacent to the falciform ligament. There are occasional subcentimeter hypodensities scattered throughout the liver that are too small to characterize. Small subcapsular calcification in the right lobe. Gallbladder physiologically distended, no calcified stone. No biliary dilatation. Pancreas: Stranding adjacent to the pancreatic head appears to be related to duodenal inflammation rather than primary pancreatic. No ductal dilatation. No evidence of focal mass. Spleen: Normal in size without focal abnormality. Adrenals/Urinary Tract: Normal adrenal glands. No hydronephrosis or renal calculi. No perinephric edema. No ureteral stone. Urinary bladder is completely  empty and not well assessed. Stomach/Bowel: Bowel assessment is limited in the absence of enteric contrast. There is a small hiatal hernia. Wall thickening of the Peri pyloric stomach, first, second and third portion of the duodenum. There is adjacent fat stranding. Moderate length segment of edematous small bowel wall thickening in the central and left abdomen. Associated mesenteric edema. No pneumatosis. The appendix is normal. Small volume of colonic stool. No colonic wall thickening or inflammation. Vascular/Lymphatic: Normal caliber abdominal aorta. No portal venous or mesenteric gas. No enlarged lymph nodes in the abdomen or pelvis. Reproductive: Unremarkable uterus. 1.4 cm cyst in the left ovary, similar to prior exam, no further follow-up is needed. Right ovary is not definitively defined. Other: Mesenteric edema and small amount of mesenteric free fluid. There is small volume ascites in the left upper quadrant. Small amount of fluid in the pericolic gutters that tracks into the pelvis. Fluid appears simple. No free air. No localized fluid collection. No abdominal wall hernia. Musculoskeletal: Degenerative disc disease at T11-T12 with mild endplate sclerosis, similar. There are no acute or suspicious osseous abnormalities. IMPRESSION: 1. Moderate length segment of edematous small bowel wall thickening in the central and left abdomen with adjacent fat stranding and small volume ascites. There is also peri pyloric and duodenal involvement of the upper GI tract. Findings consistent with enteritis, likely infectious or inflammatory. Possibility of bowel angioedema is raised. 2. Normal appendix. 3. Small hiatal hernia. Electronically Signed   By: Narda RutherfordMelanie  Sanford M.D.   On: 03/11/2021 18:09     Assessment/Plan 50 year old female with history of HTN, depression, migraines, IBS, fatty liver  previously followed by GI,, nonrheumatic mitral valve regurgitation, presenting with a 1 day history of abdominal pain  associated with nausea vomiting and diarrhea.      Severe sepsis (HC   Acute gastroenteritis in the setting of history of IBS   AKI (acute kidney injury) (HCC) - Severe sepsis criteria includes tachycardia, hypotension, AKI and acute gastroenteritis, lactic acidosis - CT abdomen and pelvis: Findings consistent with enteritis, likely infectious or inflammatory.  Possibility of bowel angioedema is raised - Continue IV fluid resuscitation - Decision to start IV antibiotics with Rocephin and Flagyl - Follow procalcitonin - Clear liquid diet - Follow GI panel and C. difficile ordered from the ED - Enteric precautions - Consider GI consult if not improving with above measures  AKI - Creatinine 1.07 above baseline of 0.7 in June 2022 - Expect improvement with IV fluid resuscitation - Continue to  monitor and avoid nephrotoxins    Fatty liver - History of fatty liver. - LFTs normal.  No acute concerns at this time    DVT prophylaxis: Lovenox  Code Status: full code  Family Communication: Husband at bedside Disposition Plan: Back to previous home environment Consults called: none  Status:At the time of admission, it appears that the appropriate admission status for this patient is INPATIENT. This is judged to be reasonable and necessary in order to provide the required intensity of service to ensure the patient's safety given the presenting symptoms, physical exam findings, and initial radiographic and laboratory data in the context of their  Comorbid conditions.   Patient requires inpatient status due to high intensity of service, high risk for further deterioration and high frequency of surveillance required.   I certify that at the point of admission it is my clinical judgment that the patient will require inpatient hospital care spanning beyond 2 midnights     Andris Baumann MD [facility]     03/11/2021, 8:44 PM

## 2021-03-11 NOTE — ED Triage Notes (Signed)
Pt brought in by [facility] with c/o abd pain, she just started on a new B/P medication. She took it this am at 0200 without food. VSS. EMS gave her 4mg  of Zofran IM. Pt is laying on the floor in triage. No emesis seen. She is able to move from the floor to the [facility] without difficulty.

## 2021-03-11 NOTE — ED Notes (Signed)
This RN bladder scanned this patient to check for urinary retention at the request of the patient due to increasing lower abdominal pressure and previous history of needing a foley for urine retention. Multiple readings were taken, all less than 32mL NP at bedside to see readings At this time, due to low readings, NP does not wish to order a foley  Patient aware of same

## 2021-03-11 NOTE — ED Triage Notes (Signed)
Pt reports abd pain and nausea since 1400 today. Pt also reports feels dizzy.

## 2021-03-11 NOTE — ED Provider Notes (Addendum)
[facility] Emergency Department Provider Note  ____________________________________________   None    (approximate)  I have reviewed the triage vital signs and the nursing notes.   HISTORY  Chief Complaint Abdominal Pain and Nausea  HPI Tabitha Carr is a 50 y.o. female the below medical history including depression, insomnia, IBS, and hypertension, presents to the ED for onset of abdominal pain with nausea since this afternoon.  Patient also reports of intermittent dizziness.  She presents via EMS from home, with abdominal pain that started after an episode of post-prandial emesis. She notes non-bloody, non-bilious emesis after eating a salad for lunch. She began her new blood pressure medicine, benazepril stating she took the first dose at about 2:00 this morning without food on her stomach.  Past Medical History:  Diagnosis Date   Abnormal LFTs    Adult ADHD    Depression    Insomnia    Irritable bowel syndrome with both constipation and diarrhea    Migraines    Vitamin D deficiency     Patient Active Problem List   Diagnosis Date Noted   Fatty liver 10/02/2018   Acute pancreatitis 07/11/2018   Vitamin D deficiency 09/26/2017   Abnormal LFTs (liver function tests) 04/26/2017   Chronic insomnia 10/18/2016   Irritable bowel syndrome with both constipation and diarrhea 10/18/2016   ADHD (attention deficit hyperactivity disorder) 07/07/2015   Depression 07/07/2015   Migraines 07/07/2015   Adult ADHD 06/15/2015    Past Surgical History:  Procedure Laterality Date   HERNIA REPAIR     TUBAL LIGATION      Prior to Admission medications   Medication Sig Start Date End Date Taking? Authorizing Provider  benazepril-hydrochlorthiazide (LOTENSIN HCT) 20-12.5 MG tablet Take 1 tablet by mouth daily. 03/10/21 03/10/22 Yes [provider]  Cholecalciferol (VITAMIN D3) 125 MCG (5000 UT) TABS Take 5,000 Units by mouth at bedtime.   Yes [provider]  Melatonin 10 MG TABS Take 10 mg by mouth at bedtime.   Yes [provider]  pantoprazole (PROTONIX) 40 MG tablet Take 40 mg by mouth at bedtime.   Yes [provider]  zolpidem (AMBIEN) 10 MG tablet Take 10 mg by mouth at bedtime.   Yes [provider]  phentermine (ADIPEX-P) 37.5 MG tablet Take 37.5 mg by mouth daily before breakfast. Patient not taking: Reported on 03/11/2021    [provider]    Allergies Latex and Meloxicam  History reviewed. No pertinent family history.  Social History Social History   Tobacco Use   Smoking status: Never   Smokeless tobacco: Never  Substance Use Topics   Alcohol use: Yes   Drug use: Never    Review of Systems  Constitutional: No fever. Reports chills Eyes: No visual changes. ENT: No sore throat. Cardiovascular: Denies chest pain. Respiratory: Denies shortness of breath. Gastrointestinal: RLE abdominal pain.  No nausea, 1 episode of vomiting.  No diarrhea.  No constipation. Genitourinary: Negative for dysuria. Musculoskeletal: Negative for back pain. Skin: Negative for rash. Neurological: Negative for headaches, focal weakness or numbness. ____________________________________________   PHYSICAL EXAM:  VITAL SIGNS: ED Triage Vitals  Enc Vitals Group     BP 03/11/21 1615 (!) 88/55     Pulse Rate 03/11/21 1615 (!) 107     Resp 03/11/21 1615 (!) 22     Temp 03/11/21 1615 (!) 97 F (36.1 C)     Temp src --      SpO2 03/11/21  1615 99 %     Weight 03/11/21 1600 190 lb (86.2 kg)     Height 03/11/21 1600 5\' 3"  (1.6 m)     Head Circumference --      Peak Flow --      Pain Score 03/11/21 1559 10     Pain Loc --      Pain Edu? --      Excl. in GC? --     Constitutional: Alert and oriented. Well appearing and in no acute distress. Eyes: Conjunctivae are normal. PERRL. EOMI. Head: Atraumatic. Neck: No stridor.   Cardiovascular: Tachy rate, regular rhythm. Grossly normal heart  sounds.  Good peripheral circulation. Respiratory: Normal respiratory effort.  No retractions. Lungs CTAB. Gastrointestinal: Soft and tender to palp over the RLQ. No distention. No abdominal bruits. No CVA tenderness. Musculoskeletal: No lower extremity tenderness nor edema.  No joint effusions. Neurologic:  Normal speech and language. No gross focal neurologic deficits are appreciated. No gait instability. Skin:  Skin is warm, dry and intact. No rash noted. Psychiatric: Mood and affect are normal. Speech and behavior are normal.  ____________________________________________   LABS (all labs ordered are listed, but only abnormal results are displayed)  Labs Reviewed  LIPASE, BLOOD - Abnormal; Notable for the following components:      Result Value   Lipase 54 (*)    All other components within normal limits  COMPREHENSIVE METABOLIC PANEL - Abnormal; Notable for the following components:   Sodium 133 (*)    Chloride 97 (*)    Glucose, Bld 124 (*)    Creatinine, Ser 1.09 (*)    All other components within normal limits  CBC - Abnormal; Notable for the following components:   WBC 24.2 (*)    Hemoglobin 15.8 (*)    MCH 34.7 (*)    Platelets 486 (*)    All other components within normal limits  URINALYSIS, COMPLETE (UACMP) WITH MICROSCOPIC - Abnormal; Notable for the following components:   Color, Urine AMBER (*)    APPearance CLOUDY (*)    Ketones, ur 5 (*)    Protein, ur 30 (*)    Bacteria, UA MANY (*)    All other components within normal limits  LACTIC ACID, PLASMA - Abnormal; Notable for the following components:   Lactic Acid, Venous 2.2 (*)    All other components within normal limits  RESP PANEL BY RT-PCR (FLU A&B, COVID) [id number]  URINE CULTURE  GASTROINTESTINAL PANEL BY PCR, STOOL (REPLACES STOOL CULTURE)  C DIFFICILE QUICK SCREEN W PCR REFLEX    LACTIC ACID, PLASMA  POC URINE PREG, ED   ____________________________________________  EKG  NSR 100 bpm PR interval  156 ms QRS duration 66 ms Normal axis No STEMI ____________________________________________  RADIOLOGY I, 03/13/21, personally viewed and evaluated these images (plain radiographs) as part of my medical decision making, as well as reviewing the written report by the radiologist.  ED MD interpretation:  agree with report  Official radiology report(s): CT ABDOMEN PELVIS WO CONTRAST  Result Date: 03/11/2021 CLINICAL DATA:  RLQ abdominal pain, appendicitis suspected (Age >= 14y) Nausea and abdominal pain today. EXAM: CT ABDOMEN AND PELVIS WITHOUT CONTRAST TECHNIQUE: Multidetector CT imaging of the abdomen and pelvis was performed following the standard protocol without IV contrast. COMPARISON:  CT 07/11/2018, abdominal MRI 09/11/2018 FINDINGS: Lower chest: No acute airspace disease or pleural effusion. Hepatobiliary: Focal fatty infiltration adjacent to the falciform ligament. There are occasional subcentimeter hypodensities scattered throughout the liver  that are too small to characterize. Small subcapsular calcification in the right lobe. Gallbladder physiologically distended, no calcified stone. No biliary dilatation. Pancreas: Stranding adjacent to the pancreatic head appears to be related to duodenal inflammation rather than primary pancreatic. No ductal dilatation. No evidence of focal mass. Spleen: Normal in size without focal abnormality. Adrenals/Urinary Tract: Normal adrenal glands. No hydronephrosis or renal calculi. No perinephric edema. No ureteral stone. Urinary bladder is completely empty and not well assessed. Stomach/Bowel: Bowel assessment is limited in the absence of enteric contrast. There is a small hiatal hernia. Wall thickening of the Peri pyloric stomach, first, second and third portion of the duodenum. There is adjacent fat stranding. Moderate length segment of edematous small bowel wall thickening in the central and left abdomen. Associated mesenteric edema. No  pneumatosis. The appendix is normal. Small volume of colonic stool. No colonic wall thickening or inflammation. Vascular/Lymphatic: Normal caliber abdominal aorta. No portal venous or mesenteric gas. No enlarged lymph nodes in the abdomen or pelvis. Reproductive: Unremarkable uterus. 1.4 cm cyst in the left ovary, similar to prior exam, no further follow-up is needed. Right ovary is not definitively defined. Other: Mesenteric edema and small amount of mesenteric free fluid. There is small volume ascites in the left upper quadrant. Small amount of fluid in the pericolic gutters that tracks into the pelvis. Fluid appears simple. No free air. No localized fluid collection. No abdominal wall hernia. Musculoskeletal: Degenerative disc disease at T11-T12 with mild endplate sclerosis, similar. There are no acute or suspicious osseous abnormalities. IMPRESSION: 1. Moderate length segment of edematous small bowel wall thickening in the central and left abdomen with adjacent fat stranding and small volume ascites. There is also peri pyloric and duodenal involvement of the upper GI tract. Findings consistent with enteritis, likely infectious or inflammatory. Possibility of bowel angioedema is raised. 2. Normal appendix. 3. Small hiatal hernia. Electronically Signed   By: Narda RutherfordMelanie  Sanford M.D.   On: 03/11/2021 18:09    ____________________________________________   PROCEDURES  Procedure(s) performed (including Critical Care):  .Critical Care E&M  Date/Time: 03/12/2021 1:27 PM Performed by: Lissa HoardMenshew, Aleister Lady V Bacon, PA-C  Critical care provider statement:    Critical care start time:  03/11/2021 4:48 PM   Critical care end time:  03/11/2021 5:28 PM   Critical care time was exclusive of:  Separately billable procedures and treating other patients   Critical care was necessary to treat or prevent imminent or life-threatening deterioration of the following conditions:  Sepsis   Critical care was time spent personally  by me on the following activities:  Development of treatment plan with patient or surrogate, discussions with consultants, evaluation of patient's response to treatment, obtaining history from patient or surrogate, ordering and performing treatments and interventions and ordering and review of laboratory studies   I assumed direction of critical care for this patient from another provider in my specialty: no     Care discussed with: admitting provider   After initial E/M assessment, critical care services were subsequently performed that were exclusive of separately billable procedures or treatment.    Morphine 2 mg IVP x 2 NS bolus 1000 ml x 1 ____________________________________________   INITIAL IMPRESSION / ASSESSMENT AND PLAN / ED COURSE  As part of my medical decision making, I reviewed the following data within the electronic MEDICAL RECORD NUMBER Labs reviewed as above, Old chart reviewed, Radiograph reviewed as above, and Notes from prior ED visits   Differential diagnosis includes, but is not limited  to, ovarian cyst, ovarian torsion, acute appendicitis, diverticulitis, urinary tract infection/pyelonephritis, endometriosis, bowel obstruction, colitis, renal colic, gastroenteritis, hernia, fibroids, endometriosis, pregnancy related pain including ectopic pregnancy, etc.  Patient with elevated white count 24.2, with a normal lactic acid, presents for abdominal pain.  Her CT of the abdomen/pelvis shows some enteritis the small intestines with some extension to the pylorus and duodenum.  There is also concern for possible intestinal angioedema.  Patient stable at this time.  Urine culture is pending, and will help guide antibiotic management.  Patient is agreeable to the plan of admission at this time for further management of her acute abdominal pain.  Care at this time is transferred to my attending, Sharyn Creamer, MD, and we will await admission to the hospitalist  service. ____________________________________________   FINAL CLINICAL IMPRESSION(S) / ED DIAGNOSES  Final diagnoses:  Enteritis     ED Discharge Orders     None        Note:  This document was prepared using Dragon voice recognition software and may include unintentional dictation errors.    Michaela Corner 03/11/21 2011    Sharyn Creamer, MD 03/12/21 0028    Lissa Hoard, PA-C 03/12/21 1330    Sharyn Creamer, MD 03/14/21 [phone]

## 2021-03-12 DIAGNOSIS — K529 Noninfective gastroenteritis and colitis, unspecified: Secondary | ICD-10-CM

## 2021-03-12 DIAGNOSIS — R6520 Severe sepsis without septic shock: Secondary | ICD-10-CM

## 2021-03-12 DIAGNOSIS — A419 Sepsis, unspecified organism: Secondary | ICD-10-CM

## 2021-03-12 LAB — BASIC METABOLIC PANEL
Anion gap: 9 (ref 5–15)
BUN: 9 mg/dL (ref 6–20)
CO2: 25 mmol/L (ref 22–32)
Calcium: 7.9 mg/dL — ABNORMAL LOW (ref 8.9–10.3)
Chloride: 100 mmol/L (ref 98–111)
Creatinine, Ser: 1.06 mg/dL — ABNORMAL HIGH (ref 0.44–1.00)
GFR, Estimated: >60 mL/min (ref >60)
Glucose, Bld: 97 mg/dL (ref 70–99)
Potassium: 3.7 mmol/L (ref 3.5–5.1)
Sodium: 134 mmol/L — ABNORMAL LOW (ref 135–145)

## 2021-03-12 LAB — CBC
HCT: 34.1 % — ABNORMAL LOW (ref 36.0–46.0)
Hemoglobin: 11.9 g/dL — ABNORMAL LOW (ref 12.0–15.0)
MCH: 34.1 pg — ABNORMAL HIGH (ref 26.0–34.0)
MCHC: 34.9 g/dL (ref 30.0–36.0)
MCV: 97.7 fL (ref 80.0–100.0)
Platelets: 309 10*3/uL (ref 150–400)
RBC: 3.49 MIL/uL — ABNORMAL LOW (ref 3.87–5.11)
RDW: 12.7 % (ref 11.5–15.5)
WBC: 13.6 10*3/uL — ABNORMAL HIGH (ref 4.0–10.5)
nRBC: 0 % (ref 0.0–0.2)

## 2021-03-12 LAB — PROTIME-INR
INR: 1 (ref 0.8–1.2)
Prothrombin Time: 13.1 seconds (ref 11.4–15.2)

## 2021-03-12 LAB — CORTISOL-AM, BLOOD: Cortisol - AM: 5.8 ug/dL — ABNORMAL LOW (ref 6.7–22.6)

## 2021-03-12 LAB — HIV ANTIBODY (ROUTINE TESTING W REFLEX): HIV Screen 4th Generation wRfx: NONREACTIVE

## 2021-03-12 LAB — PROCALCITONIN: Procalcitonin: <0.1 ng/mL

## 2021-03-12 MED ORDER — METHYLPREDNISOLONE SODIUM SUCC 125 MG IJ SOLR
60.0000 mg | Freq: Once | INTRAMUSCULAR | Status: AC
  Administered 2021-03-12: 60 mg via INTRAVENOUS
  Filled 2021-03-12: qty 2

## 2021-03-12 NOTE — Consult Note (Addendum)
Cephas Darby, MD [address]  [place]  [place], [place] [address]  Main: [phone]  Fax: [phone] Pager: [phone]   Consultation  Referring Provider:     No ref. provider found Primary Care Physician:  Idelle Crouch, MD Primary Gastroenterologist:  Dr. Lucilla Lame          Reason for Consultation:     Nausea, vomiting, abdominal pain  Date of Admission:  03/11/2021 Date of Consultation:  03/12/2021         HPI:   Tabitha Carr is a 50 y.o. female with history of alcohol use, hypertension, fatty liver, obesity presented to ER yesterday afternoon secondary to abdominal pain, bloating, nausea and vomiting which started after eating salad from outside.  Patient denies any fever, chills.  Patient was hypotensive in the ER, she started new ACE inhibitor yesterday.  In the ER her vitals improved and she was hemodynamically stable, labs were significant for leukocytosis, thrombocytosis.  No AKI, LFTs normal, mildly elevated serum lipase levels.  Underwent stool studies including C. difficile which were negative for infection.  Her lactic acid was mildly elevated, procalcitonin levels were normal.  She is started empirically on antibiotics.  Underwent CT abdomen and pelvis without contrast which revealed erythema around the duodenal as well as small bowel loops associated with small volume ascites.  Leukocytosis has significantly improved since admission, after receiving fluids.  She continues to have mild abdominal discomfort and bloating.  Denies any nausea or vomiting.  She is tolerating full liquids well.  She does not smoke.  She drinks 2 beers daily Patient has mild normocytic anemia.  Thrombocytosis has resolved  Patient denies family history of GI malignancy, celiac disease, IBD  NSAIDs: None  Antiplts/Anticoagulants/Anti thrombotics: None  GI Procedures:  EGD and colonoscopy at [facility] 04/19/2017 Upper endoscopy 04/19/2017 Impression:          - Normal  esophagus.                       - Erythematous mucosa in the antrum. Biopsied.                       - No gross lesions in the entire examined duodenum.    Colonoscopy for hematochezia Impression:          - The examined portion of the ileum was normal.                       - One 5 mm polyp in the descending colon, removed with a                       cold snare. Resected and retrieved.                       - Internal hemorrhoids.                       - No obvious source of lower GI bleeding apart from                       hemorrhoids.                       - Biopsies were taken with a cold forceps from the right  colon and left colon for evaluation of microscopic                       colitis.    Diagnosis   [facility]  A: Stomach, biopsy - Oxyntic mucosa with PPI effect - Foveolar hyperplasia, consistent with reactive gastropathy - No Helicobacter identified   B: Colon, random, biopsy - No significant pathologic abnormality - No evidence for microscopic colitis or other inflammatory process   C: Colon, descending, biopsy - Adenomatous polyp (1 fragment)    Past Medical History:  Diagnosis Date   Abnormal LFTs    Adult ADHD    Depression    Insomnia    Irritable bowel syndrome with both constipation and diarrhea    Migraines    Vitamin D deficiency     Past Surgical History:  Procedure Laterality Date   HERNIA REPAIR     TUBAL LIGATION      Prior to Admission medications   Medication Sig Start Date End Date Taking? Authorizing Provider  benazepril-hydrochlorthiazide (LOTENSIN HCT) 20-12.5 MG tablet Take 1 tablet by mouth daily. 03/10/21 03/10/22 Yes [provider]  Cholecalciferol (VITAMIN D3) 125 MCG (5000 UT) TABS Take 5,000 Units by mouth at bedtime.   Yes [provider]  Melatonin 10 MG TABS Take 10 mg by mouth at bedtime.   Yes [provider]  pantoprazole (PROTONIX) 40 MG tablet  Take 40 mg by mouth at bedtime.   Yes [provider]  zolpidem (AMBIEN) 10 MG tablet Take 10 mg by mouth at bedtime.   Yes [provider]  phentermine (ADIPEX-P) 37.5 MG tablet Take 37.5 mg by mouth daily before breakfast. Patient not taking: Reported on 03/11/2021    [provider]    Current Facility-Administered Medications:    acetaminophen (TYLENOL) tablet 650 mg, 650 mg, Oral, Q6H PRN **OR** acetaminophen (TYLENOL) suppository 650 mg, 650 mg, Rectal, Q6H PRN, Athena Masse, MD   enoxaparin (LOVENOX) injection 40 mg, 40 mg, Subcutaneous, Q24H, Judd Gaudier V, MD, 40 mg at 03/11/21 2240   HYDROcodone-acetaminophen (NORCO/VICODIN) 5-325 MG per tablet 1-2 tablet, 1-2 tablet, Oral, Q4H PRN, Athena Masse, MD   morphine 2 MG/ML injection 2 mg, 2 mg, Intravenous, Q2H PRN, Judd Gaudier V, MD, 2 mg at 03/12/21 0748   ondansetron (ZOFRAN) tablet 4 mg, 4 mg, Oral, Q6H PRN **OR** ondansetron (ZOFRAN) injection 4 mg, 4 mg, Intravenous, Q6H PRN, Athena Masse, MD  Current Outpatient Medications:    benazepril-hydrochlorthiazide (LOTENSIN HCT) 20-12.5 MG tablet, Take 1 tablet by mouth daily., Disp: , Rfl:    Cholecalciferol (VITAMIN D3) 125 MCG (5000 UT) TABS, Take 5,000 Units by mouth at bedtime., Disp: , Rfl:    Melatonin 10 MG TABS, Take 10 mg by mouth at bedtime., Disp: , Rfl:    pantoprazole (PROTONIX) 40 MG tablet, Take 40 mg by mouth at bedtime., Disp: , Rfl:    zolpidem (AMBIEN) 10 MG tablet, Take 10 mg by mouth at bedtime., Disp: , Rfl:    phentermine (ADIPEX-P) 37.5 MG tablet, Take 37.5 mg by mouth daily before breakfast. (Patient not taking: Reported on 03/11/2021), Disp: , Rfl:   History reviewed. No pertinent family history.   Social History   Tobacco Use   Smoking status: Never   Smokeless tobacco: Never  Substance Use Topics   Alcohol use: Yes   Drug use: Never    Allergies as of 03/11/2021 - Review Complete 03/11/2021  Allergen Reaction  Noted   Latex Other (See Comments) 07/31/2018   Meloxicam Swelling 04/28/2016    Review of Systems:    All systems reviewed and negative except where noted in HPI.   Physical Exam:  Vital signs in last 24 hours: Temp:  [97 F (36.1 C)-97.9 F (36.6 C)] 97.9 F (36.6 C) (07/23 0420) Pulse Rate:  [74-107] 79 (07/23 1000) Resp:  [11-22] 14 (07/23 1000) BP: (88-150)/(55-81) 134/73 (07/23 1000) SpO2:  [95 %-100 %] 99 % (07/23 1000) Weight:  [86.2 kg] 86.2 kg (07/22 1600)   General:   Pleasant, cooperative in NAD Head:  Normocephalic and atraumatic. Eyes:   No icterus.   Conjunctiva pink. PERRLA. Ears:  Normal auditory acuity. Neck:  Supple; no masses or thyroidomegaly Lungs: Respirations even and unlabored. Lungs clear to auscultation bilaterally.   No wheezes, crackles, or rhonchi.  Heart:  Regular rate and rhythm;  Without murmur, clicks, rubs or gallops Abdomen:  Soft, mildly diffusely distended, mild diffuse tenderness. Normal bowel sounds. No appreciable masses or hepatomegaly.  No rebound or guarding.  Rectal:  Not performed. Msk:  Symmetrical without gross deformities.  Strength   Extremities:  Without edema, cyanosis or clubbing. Neurologic:  Alert and oriented x3;  grossly normal neurologically. Skin:  Intact without significant lesions or rashes. Psych:  Alert and cooperative. Normal affect.  LAB RESULTS: CBC Latest Ref Rng & Units 03/12/2021 03/11/2021 01/10/2020  WBC 4.0 - 10.5 K/uL 13.6(H) 24.2(H) 8.0  Hemoglobin 12.0 - 15.0 g/dL 11.9(L) 15.8(H) 13.2  Hematocrit 36.0 - 46.0 % 34.1(L) 44.6 35.8(L)  Platelets 150 - 400 K/uL 309 486(H) 355    BMET BMP Latest Ref Rng & Units 03/12/2021 03/11/2021 01/10/2020  Glucose 70 - 99 mg/dL 97 124(H) 87  BUN 6 - 20 mg/dL _0 Creatinine 0.44 - 1.00 mg/dL 1.06(H) 1.09(H) 0.76  Sodium 135 - 145 mmol/L 134(L) 133(L) 130(L)  Potassium 3.5 - 5.1 mmol/L 3.7 4.0 3.7  Chloride 98 - 111 mmol/L 100 97(L) 96(L)  CO2 22 - 32 mmol/L _1 Calcium 8.9 - 10.3 mg/dL 7.9(L) 8.9 9.1    LFT Hepatic Function Latest Ref Rng & Units 03/11/2021 01/10/2020 07/13/2018  Total Protein 6.5 - 8.1 g/dL 7.2 7.5 6.1(L)  Albumin 3.5 - 5.0 g/dL 3.5 4.4 3.2(L)  AST 15 - 41 U/L 41 35 22  ALT 0 - 44 U/L 32 33 15  Alk Phosphatase 38 - 126 U/L 88 87 77  Total Bilirubin 0.3 - 1.2 mg/dL 1.1 0.6 1.1     STUDIES: CT ABDOMEN PELVIS WO CONTRAST  Result Date: 03/11/2021 CLINICAL DATA:  RLQ abdominal pain, appendicitis suspected (Age >= 14y) Nausea and abdominal pain today. EXAM: CT ABDOMEN AND PELVIS WITHOUT CONTRAST TECHNIQUE: Multidetector CT imaging of the abdomen and pelvis was performed following the standard protocol without IV contrast. COMPARISON:  CT 07/11/2018, abdominal MRI 09/11/2018 FINDINGS: Lower chest: No acute airspace disease or pleural effusion. Hepatobiliary: Focal fatty infiltration adjacent to the falciform ligament. There are occasional subcentimeter hypodensities scattered throughout the liver that are too small to characterize. Small subcapsular calcification in the right lobe. Gallbladder physiologically distended, no calcified stone. No biliary dilatation. Pancreas: Stranding adjacent to the pancreatic head appears to be related to duodenal inflammation rather than primary pancreatic. No ductal dilatation. No evidence of focal mass. Spleen: Normal in size without focal abnormality. Adrenals/Urinary Tract: Normal adrenal glands. No hydronephrosis or renal calculi. No perinephric edema. No ureteral stone. Urinary bladder  is completely empty and not well assessed. Stomach/Bowel: Bowel assessment is limited in the absence of enteric contrast. There is a small hiatal hernia. Wall thickening of the Peri pyloric stomach, first, second and third portion of the duodenum. There is adjacent fat stranding. Moderate length segment of edematous small bowel wall thickening in the central and left abdomen. Associated mesenteric edema. No pneumatosis.  The appendix is normal. Small volume of colonic stool. No colonic wall thickening or inflammation. Vascular/Lymphatic: Normal caliber abdominal aorta. No portal venous or mesenteric gas. No enlarged lymph nodes in the abdomen or pelvis. Reproductive: Unremarkable uterus. 1.4 cm cyst in the left ovary, similar to prior exam, no further follow-up is needed. Right ovary is not definitively defined. Other: Mesenteric edema and small amount of mesenteric free fluid. There is small volume ascites in the left upper quadrant. Small amount of fluid in the pericolic gutters that tracks into the pelvis. Fluid appears simple. No free air. No localized fluid collection. No abdominal wall hernia. Musculoskeletal: Degenerative disc disease at T11-T12 with mild endplate sclerosis, similar. There are no acute or suspicious osseous abnormalities. IMPRESSION: 1. Moderate length segment of edematous small bowel wall thickening in the central and left abdomen with adjacent fat stranding and small volume ascites. There is also peri pyloric and duodenal involvement of the upper GI tract. Findings consistent with enteritis, likely infectious or inflammatory. Possibility of bowel angioedema is raised. 2. Normal appendix. 3. Small hiatal hernia. Electronically Signed   By: Keith Rake M.D.   On: 03/11/2021 18:09      Impression / Plan:   Tabitha Carr is a 50 y.o. female with obesity, fatty liver, alcohol use who is admitted with 1 day history of abdominal pain, bloating, nausea, vomiting and nonbloody diarrhea.  Stool studies are negative for infectious etiology.  CT abdomen pelvis without contrast revealed proximal and mid small bowel edema.  Patient has mild lactic acidosis, leukocytosis has significantly improved.  She also started ACE inhibitor yesterday which is new.  Likely viral enteritis/food poisoning or angioedema.  Since this is the initial episode and patient never had such symptoms before and underwent EGD and  colonoscopy with TI evaluation and biopsies in 2018, no indication for endoscopic evaluation as inpatient at this time  Okay to discontinue antibiotics Consider IV Solu-Medrol 60 mg x 1 for possible angioedema Check C1 esterase inhibitor Check fecal calprotectin levels Advance diet slowly Discuss about endoscopic evaluation as outpatient if her symptoms are persistent and based on the above work-up  Thank you for involving me in the care of this patient.  GI will follow along with you    LOS: 1 day   Sherri Sear, MD  03/12/2021, 1:49 PM    Note: This dictation was prepared with Dragon dictation along with smaller phrase technology. Any transcriptional errors that result from this process are unintentional.

## 2021-03-12 NOTE — ED Notes (Signed)
Phlebotomy to obtain morning labs.  

## 2021-03-12 NOTE — Discharge Summary (Signed)
Physician Discharge Summary   Tabitha Carr  female DOB: [birth year]  WUJ:[id number]  PCP: Marguarite ArbourSparks, Jeffrey D, MD  Admit date: 03/11/2021 Discharge date: 03/12/2021  Admitted From: home Disposition:  home CODE STATUS: DNR  Discharge Instructions     Discharge instructions   Complete by: As directed    You have small bowel enteritis.  GI has seen you and does not think you need antibiotics.  You received a dose of strong steroid to help with the swelling and possible bowel angioedema.   Not likely that your new blood pressure medication Lotensin (the benazepril) is to blame, but just to be safe, I have discontinued it for now.  Please follow up with your PCP for further blood pressure management.  Advance your diet gently.   Dr. Darlin Priestlyina Nico Syme [facility]- -        Hospital Course:  For full details, please see H&P, progress notes, consult notes and ancillary notes.  Briefly,  Tabitha Carr is a 50 y.o. female with medical history significant for HTN, depression, migraines, IBS, fatty liver  previously followed by GI, nonrheumatic mitral valve regurgitation, presenting with a 1 day history of abdominal pain associated with nausea vomiting and diarrhea.    Denies offending foods and has no affected contacts.  has had no fevers.  Has concern symptoms might be related to an on new antihypertensive medication, benazepril which pt only took 1 dose prior to symptom onset.  Sepsis ruled out --does not meet criteria.  Pt only had tachycardia.  Acute gastroenteritis in the setting of history of IBS - CT abdomen and pelvis: Findings consistent with enteritis, likely infectious or inflammatory.  Possibility of bowel angioedema is raised - received IV fluid resuscitation - started on IV antibiotics with Rocephin and Flagyl on presentation, since d/c'ed, since no sign of sepsis, procal neg, GI path and C diff neg.  GI consulted and in agreement with discontinuing abx.   --gave IV Solu-Medrol 60 mg x 1  for possible angioedema, per GI rec. --Pt felt much better and able to tolerate oral intake by next day, so was discharged after GI cleared. --even though it's unlikely pt developed possible angioedema after just 1 dose of benazepril-HCTZ, this med was discontinued to be on the safe side.   AKI - Creatinine 1.07 above baseline of 0.7 in June 2022 --s/p IVF.  Encourage oral hydration.     Fatty liver - LFTs normal.  No acute concerns at this time  HTN --even though it's unlikely pt developed possible angioedema after just 1 dose of benazepril-HCTZ, this med was discontinued to be on the safe side. Pt was advised to follow up with PCP for further BP management.   Discharge Diagnoses:  Principal Problem:   Severe sepsis (HCC) Active Problems:   Irritable bowel syndrome with both constipation and diarrhea   Fatty liver   Acute gastroenteritis   AKI (acute kidney injury) (HCC)   30 Day Unplanned Readmission Risk Score    [facility]  30 Day Unplanned Readmission Risk Score (%) 11.32 Filed at 03/12/2021 1200       This score is the patient's risk of an unplanned readmission within 30 days of being discharged (0 -100%). The score is based on dignosis, age, lab data, medications, orders, and past utilization.   Low:  0-14.9   Medium: 15-21.9   High: 22-29.9   Extreme: 30 and above  Discharge Instructions:  Allergies as of 03/12/2021       Reactions   Latex Other (See Comments)   Internal irritation   Meloxicam Swelling        Medication List     STOP taking these medications    benazepril-hydrochlorthiazide 20-12.5 MG tablet Commonly known as: LOTENSIN HCT   phentermine 37.5 MG tablet Commonly known as: ADIPEX-P       TAKE these medications    Melatonin 10 MG Tabs Take 10 mg by mouth at bedtime.   pantoprazole 40 MG tablet Commonly known as: PROTONIX Take 40 mg by mouth  at bedtime.   Vitamin D3 125 MCG (5000 UT) Tabs Take 5,000 Units by mouth at bedtime.   zolpidem 10 MG tablet Commonly known as: AMBIEN Take 10 mg by mouth at bedtime.         Follow-up Information     Marguarite Arbour, MD Follow up in 1 week(s).   Specialty: Internal Medicine Contact information: [address] [facility] [place] [address] [phone]                 Allergies  Allergen Reactions   Latex Other (See Comments)    Internal irritation    Meloxicam Swelling     The results of significant diagnostics from this hospitalization (including imaging, microbiology, ancillary and laboratory) are listed below for reference.   Consultations:   Procedures/Studies: CT ABDOMEN PELVIS WO CONTRAST  Result Date: 03/11/2021 CLINICAL DATA:  RLQ abdominal pain, appendicitis suspected (Age >= 14y) Nausea and abdominal pain today. EXAM: CT ABDOMEN AND PELVIS WITHOUT CONTRAST TECHNIQUE: Multidetector CT imaging of the abdomen and pelvis was performed following the standard protocol without IV contrast. COMPARISON:  CT 07/11/2018, abdominal MRI 09/11/2018 FINDINGS: Lower chest: No acute airspace disease or pleural effusion. Hepatobiliary: Focal fatty infiltration adjacent to the falciform ligament. There are occasional subcentimeter hypodensities scattered throughout the liver that are too small to characterize. Small subcapsular calcification in the right lobe. Gallbladder physiologically distended, no calcified stone. No biliary dilatation. Pancreas: Stranding adjacent to the pancreatic head appears to be related to duodenal inflammation rather than primary pancreatic. No ductal dilatation. No evidence of focal mass. Spleen: Normal in size without focal abnormality. Adrenals/Urinary Tract: Normal adrenal glands. No hydronephrosis or renal calculi. No perinephric edema. No ureteral stone. Urinary bladder is completely empty and not well assessed.  Stomach/Bowel: Bowel assessment is limited in the absence of enteric contrast. There is a small hiatal hernia. Wall thickening of the Peri pyloric stomach, first, second and third portion of the duodenum. There is adjacent fat stranding. Moderate length segment of edematous small bowel wall thickening in the central and left abdomen. Associated mesenteric edema. No pneumatosis. The appendix is normal. Small volume of colonic stool. No colonic wall thickening or inflammation. Vascular/Lymphatic: Normal caliber abdominal aorta. No portal venous or mesenteric gas. No enlarged lymph nodes in the abdomen or pelvis. Reproductive: Unremarkable uterus. 1.4 cm cyst in the left ovary, similar to prior exam, no further follow-up is needed. Right ovary is not definitively defined. Other: Mesenteric edema and small amount of mesenteric free fluid. There is small volume ascites in the left upper quadrant. Small amount of fluid in the pericolic gutters that tracks into the pelvis. Fluid appears simple. No free air. No localized fluid collection. No abdominal wall hernia. Musculoskeletal: Degenerative disc disease at T11-T12 with mild endplate sclerosis, similar. There are no acute or suspicious osseous abnormalities. IMPRESSION: 1. Moderate length  segment of edematous small bowel wall thickening in the central and left abdomen with adjacent fat stranding and small volume ascites. There is also peri pyloric and duodenal involvement of the upper GI tract. Findings consistent with enteritis, likely infectious or inflammatory. Possibility of bowel angioedema is raised. 2. Normal appendix. 3. Small hiatal hernia. Electronically Signed   By: Narda Rutherford M.D.   On: 03/11/2021 18:09      Labs: BNP (last 3 results) No results for input(s): BNP in the last 8760 hours. Basic Metabolic Panel: Recent Labs  Lab 03/11/21 1602 03/12/21 0653  NA 133* 134*  K 4.0 3.7  CL 97* 100  CO2 24 25  GLUCOSE 124* 97  BUN 8 9   CREATININE 1.09* 1.06*  CALCIUM 8.9 7.9*   Liver Function Tests: Recent Labs  Lab 03/11/21 1602  AST 41  ALT 32  ALKPHOS 88  BILITOT 1.1  PROT 7.2  ALBUMIN 3.5   Recent Labs  Lab 03/11/21 1602  LIPASE 54*   No results for input(s): AMMONIA in the last 168 hours. CBC: Recent Labs  Lab 03/11/21 1602 03/12/21 0653  WBC 24.2* 13.6*  HGB 15.8* 11.9*  HCT 44.6 34.1*  MCV 98.0 97.7  PLT 486* 309   Cardiac Enzymes: No results for input(s): CKTOTAL, CKMB, CKMBINDEX, TROPONINI in the last 168 hours. BNP: Invalid input(s): POCBNP CBG: No results for input(s): GLUCAP in the last 168 hours. D-Dimer No results for input(s): DDIMER in the last 72 hours. Hgb A1c No results for input(s): HGBA1C in the last 72 hours. Lipid Profile No results for input(s): CHOL, HDL, LDLCALC, TRIG, CHOLHDL, LDLDIRECT in the last 72 hours. Thyroid function studies No results for input(s): TSH, T4TOTAL, T3FREE, THYROIDAB in the last 72 hours.  Invalid input(s): FREET3 Anemia work up No results for input(s): VITAMINB12, FOLATE, FERRITIN, TIBC, IRON, RETICCTPCT in the last 72 hours. Urinalysis    Component Value Date/Time   COLORURINE AMBER (A) 03/11/2021 1637   APPEARANCEUR CLOUDY (A) 03/11/2021 1637   LABSPEC 1.021 03/11/2021 1637   PHURINE 5.0 03/11/2021 1637   GLUCOSEU NEGATIVE 03/11/2021 1637   HGBUR NEGATIVE 03/11/2021 1637   BILIRUBINUR NEGATIVE 03/11/2021 1637   KETONESUR 5 (A) 03/11/2021 1637   PROTEINUR 30 (A) 03/11/2021 1637   NITRITE NEGATIVE 03/11/2021 1637   LEUKOCYTESUR NEGATIVE 03/11/2021 1637   Sepsis Labs Invalid input(s): PROCALCITONIN,  WBC,  LACTICIDVEN Microbiology Recent Results (from the past 240 hour(s))  Resp Panel by RT-PCR (Flu A&B, Covid) Nasopharyngeal Swab     Status: None   Collection Time: 03/11/21  6:08 PM   Specimen: Nasopharyngeal Swab; Nasopharyngeal(NP) swabs in vial transport medium  Result Value Ref Range Status   SARS Coronavirus 2 by RT PCR  NEGATIVE NEGATIVE Final    Comment: (NOTE) SARS-CoV-2 target nucleic acids are NOT DETECTED.  The SARS-CoV-2 RNA is generally detectable in upper respiratory specimens during the acute phase of infection. The lowest concentration of SARS-CoV-2 viral copies this assay can detect is 138 copies/mL. A negative result does not preclude SARS-Cov-2 infection and should not be used as the sole basis for treatment or other patient management decisions. A negative result may occur with  improper specimen collection/handling, submission of specimen other than nasopharyngeal swab, presence of viral mutation(s) within the areas targeted by this assay, and inadequate number of viral copies(<138 copies/mL). A negative result must be combined with clinical observations, patient history, and epidemiological information. The expected result is Negative.  Fact Sheet for Patients:  BloggerCourse.com  Fact Sheet for Healthcare Providers:  SeriousBroker.it  This test is no t yet approved or cleared by the [place] FDA and  has been authorized for detection and/or diagnosis of SARS-CoV-2 by FDA under an Emergency Use Authorization (EUA). This EUA will remain  in effect (meaning this test can be used) for the duration of the COVID-19 declaration under Section 564(b)(1) of the Act, 21 U.S.C.section 360bbb-3(b)(1), unless the authorization is terminated  or revoked sooner.       Influenza A by PCR NEGATIVE NEGATIVE Final   Influenza B by PCR NEGATIVE NEGATIVE Final    Comment: (NOTE) The Xpert Xpress SARS-CoV-2/FLU/RSV plus assay is intended as an aid in the diagnosis of influenza from Nasopharyngeal swab specimens and should not be used as a sole basis for treatment. Nasal washings and aspirates are unacceptable for Xpert Xpress SARS-CoV-2/FLU/RSV testing.  Fact Sheet for Patients: BloggerCourse.com  Fact Sheet for  Healthcare Providers: SeriousBroker.it  This test is not yet approved or cleared by the [place] FDA and has been authorized for detection and/or diagnosis of SARS-CoV-2 by FDA under an Emergency Use Authorization (EUA). This EUA will remain in effect (meaning this test can be used) for the duration of the COVID-19 declaration under Section 564(b)(1) of the Act, 21 U.S.C. section 360bbb-3(b)(1), unless the authorization is terminated or revoked.  Performed at [facility], [address] [place], [place] [address]   Gastrointestinal Panel by PCR , Stool     Status: None   Collection Time: 03/11/21  7:23 PM   Specimen: Stool  Result Value Ref Range Status   Campylobacter species NOT DETECTED NOT DETECTED Final   Plesimonas shigelloides NOT DETECTED NOT DETECTED Final   Salmonella species NOT DETECTED NOT DETECTED Final   Yersinia enterocolitica NOT DETECTED NOT DETECTED Final   Vibrio species NOT DETECTED NOT DETECTED Final   Vibrio cholerae NOT DETECTED NOT DETECTED Final   Enteroaggregative E coli (EAEC) NOT DETECTED NOT DETECTED Final   Enteropathogenic E coli (EPEC) NOT DETECTED NOT DETECTED Final   Enterotoxigenic E coli (ETEC) NOT DETECTED NOT DETECTED Final   Shiga like toxin producing E coli (STEC) NOT DETECTED NOT DETECTED Final   Shigella/Enteroinvasive E coli (EIEC) NOT DETECTED NOT DETECTED Final   Cryptosporidium NOT DETECTED NOT DETECTED Final   Cyclospora cayetanensis NOT DETECTED NOT DETECTED Final   Entamoeba histolytica NOT DETECTED NOT DETECTED Final   Giardia lamblia NOT DETECTED NOT DETECTED Final   Adenovirus F40/41 NOT DETECTED NOT DETECTED Final   Astrovirus NOT DETECTED NOT DETECTED Final   Norovirus GI/GII NOT DETECTED NOT DETECTED Final   Rotavirus A NOT DETECTED NOT DETECTED Final   Sapovirus (I, II, IV, and V) NOT DETECTED NOT DETECTED Final    Comment: Performed at [facility], [address]  [place], [place] [address]  C Difficile Quick Screen w PCR reflex     Status: None   Collection Time: 03/11/21  7:23 PM   Specimen: Stool  Result Value Ref Range Status   C Diff antigen NEGATIVE NEGATIVE Final   C Diff toxin NEGATIVE NEGATIVE Final   C Diff interpretation No C. difficile detected.  Final    Comment: Performed at [facility], [address] [place], [place] [address]     Total time spend on discharging this patient, including the last patient exam, discussing the hospital stay, instructions for ongoing care as it relates to all pertinent caregivers, as well as preparing the  medical discharge records, prescriptions, and/or referrals as applicable, is 60 minutes.    Darlin Priestly, MD  [facility] 03/12/2021, 3:00 PM

## 2021-03-12 NOTE — ED Notes (Addendum)
Pt family at bedside at this time. No needs expressed at this time. Pt able to ambulate to the restroom with no assistance.

## 2021-03-13 LAB — URINE CULTURE: Culture: <10000 — AB

## 2021-03-14 LAB — C1 ESTERASE INHIBITOR: C1INH SerPl-mCnc: 28 mg/dL (ref 21–39)

## 2021-03-18 LAB — CALPROTECTIN, FECAL: Calprotectin, Fecal: 27 ug/g (ref 0–120)

## 2021-09-27 ENCOUNTER — Other Ambulatory Visit: Payer: Self-pay | Admitting: Internal Medicine

## 2021-09-27 ENCOUNTER — Other Ambulatory Visit (HOSPITAL_COMMUNITY): Payer: Self-pay | Admitting: Internal Medicine

## 2021-09-27 DIAGNOSIS — R7989 Other specified abnormal findings of blood chemistry: Secondary | ICD-10-CM

## 2021-09-30 ENCOUNTER — Ambulatory Visit: Payer: BC Managed Care – PPO

## 2021-10-07 ENCOUNTER — Ambulatory Visit: Payer: BC Managed Care – PPO

## 2022-10-02 ENCOUNTER — Other Ambulatory Visit: Payer: Self-pay | Admitting: Internal Medicine

## 2022-10-02 DIAGNOSIS — N644 Mastodynia: Secondary | ICD-10-CM

## 2022-10-03 ENCOUNTER — Other Ambulatory Visit: Payer: Self-pay | Admitting: *Deleted

## 2022-10-03 ENCOUNTER — Inpatient Hospital Stay
Admission: RE | Admit: 2022-10-03 | Discharge: 2022-10-03 | Disposition: A | Discharge status: Home / Self Care | Payer: Self-pay | Source: Ambulatory Visit | Attending: *Deleted | Admitting: *Deleted

## 2022-10-03 DIAGNOSIS — Z1231 Encounter for screening mammogram for malignant neoplasm of breast: Secondary | ICD-10-CM

## 2023-02-02 ENCOUNTER — Encounter (HOSPITAL_BASED_OUTPATIENT_CLINIC_OR_DEPARTMENT_OTHER): Payer: Self-pay

## 2023-02-02 DIAGNOSIS — G4733 Obstructive sleep apnea (adult) (pediatric): Secondary | ICD-10-CM

## 2023-02-02 DIAGNOSIS — R0683 Snoring: Secondary | ICD-10-CM

## 2023-02-13 ENCOUNTER — Ambulatory Visit: Payer: Managed Care, Other (non HMO) | Attending: Otolaryngology

## 2023-02-13 DIAGNOSIS — G4733 Obstructive sleep apnea (adult) (pediatric): Secondary | ICD-10-CM | POA: Insufficient documentation

## 2023-02-13 DIAGNOSIS — G2581 Restless legs syndrome: Secondary | ICD-10-CM | POA: Diagnosis not present

## 2023-02-13 DIAGNOSIS — G4736 Sleep related hypoventilation in conditions classified elsewhere: Secondary | ICD-10-CM | POA: Diagnosis not present

## 2023-02-13 DIAGNOSIS — G4761 Periodic limb movement disorder: Secondary | ICD-10-CM | POA: Diagnosis not present

## 2023-02-13 DIAGNOSIS — R400 Somnolence: Secondary | ICD-10-CM | POA: Diagnosis present

## 2023-02-21 ENCOUNTER — Other Ambulatory Visit: Payer: Self-pay | Admitting: Physician Assistant

## 2023-02-21 DIAGNOSIS — R519 Headache, unspecified: Secondary | ICD-10-CM

## 2023-02-21 DIAGNOSIS — M542 Cervicalgia: Secondary | ICD-10-CM

## 2023-02-21 DIAGNOSIS — M5481 Occipital neuralgia: Secondary | ICD-10-CM

## 2023-02-21 DIAGNOSIS — R202 Paresthesia of skin: Secondary | ICD-10-CM

## 2023-03-15 ENCOUNTER — Ambulatory Visit
Admission: RE | Admit: 2023-03-15 | Discharge: 2023-03-15 | Disposition: A | Discharge status: Home / Self Care | Payer: Managed Care, Other (non HMO) | Source: Ambulatory Visit | Attending: Physician Assistant | Admitting: Physician Assistant

## 2023-03-15 DIAGNOSIS — R202 Paresthesia of skin: Secondary | ICD-10-CM

## 2023-03-15 DIAGNOSIS — R519 Headache, unspecified: Secondary | ICD-10-CM

## 2023-03-15 DIAGNOSIS — M5481 Occipital neuralgia: Secondary | ICD-10-CM

## 2023-03-15 DIAGNOSIS — M542 Cervicalgia: Secondary | ICD-10-CM

## 2023-03-15 MED ORDER — GADOPICLENOL 0.5 MMOL/ML IV SOLN
10.0000 mL | Freq: Once | INTRAVENOUS | Status: AC | PRN
  Administered 2023-03-15: 10 mL via INTRAVENOUS

## 2023-05-02 ENCOUNTER — Other Ambulatory Visit: Payer: Self-pay | Admitting: Neurology

## 2023-05-02 DIAGNOSIS — G8929 Other chronic pain: Secondary | ICD-10-CM

## 2023-05-16 ENCOUNTER — Ambulatory Visit
Admission: RE | Admit: 2023-05-16 | Discharge: 2023-05-16 | Disposition: A | Discharge status: Home / Self Care | Payer: Managed Care, Other (non HMO) | Source: Ambulatory Visit | Attending: Neurology | Admitting: Neurology

## 2023-05-16 DIAGNOSIS — G8929 Other chronic pain: Secondary | ICD-10-CM

## 2024-07-23 ENCOUNTER — Emergency Department (HOSPITAL_COMMUNITY)
Admission: EM | Admit: 2024-07-23 | Discharge: 2024-07-23 | Disposition: A | Discharge status: Home / Self Care | Attending: Emergency Medicine | Admitting: Emergency Medicine

## 2024-07-23 ENCOUNTER — Emergency Department (HOSPITAL_COMMUNITY)

## 2024-07-23 ENCOUNTER — Other Ambulatory Visit: Payer: Self-pay

## 2024-07-23 ENCOUNTER — Encounter (HOSPITAL_COMMUNITY): Payer: Self-pay | Admitting: Emergency Medicine

## 2024-07-23 DIAGNOSIS — F1090 Alcohol use, unspecified, uncomplicated: Secondary | ICD-10-CM | POA: Diagnosis not present

## 2024-07-23 DIAGNOSIS — R531 Weakness: Secondary | ICD-10-CM

## 2024-07-23 DIAGNOSIS — E162 Hypoglycemia, unspecified: Secondary | ICD-10-CM | POA: Diagnosis not present

## 2024-07-23 DIAGNOSIS — Y906 Blood alcohol level of 120-199 mg/100 ml: Secondary | ICD-10-CM | POA: Diagnosis not present

## 2024-07-23 DIAGNOSIS — R110 Nausea: Secondary | ICD-10-CM | POA: Diagnosis present

## 2024-07-23 LAB — URINALYSIS, ROUTINE W REFLEX MICROSCOPIC
Bilirubin Urine: NEGATIVE
Glucose, UA: NEGATIVE mg/dL
Hgb urine dipstick: NEGATIVE
Ketones, ur: NEGATIVE mg/dL
Leukocytes,Ua: NEGATIVE
Nitrite: NEGATIVE
Protein, ur: NEGATIVE mg/dL
Specific Gravity, Urine: 1.001 — ABNORMAL LOW (ref 1.005–1.030)
pH: 6 (ref 5.0–8.0)

## 2024-07-23 LAB — COMPREHENSIVE METABOLIC PANEL WITH GFR
ALT: 224 U/L — ABNORMAL HIGH (ref 0–44)
AST: 459 U/L — ABNORMAL HIGH (ref 15–41)
Albumin: 3.7 g/dL (ref 3.5–5.0)
Alkaline Phosphatase: 118 U/L (ref 38–126)
Anion gap: 17 — ABNORMAL HIGH (ref 5–15)
BUN: <5 mg/dL — ABNORMAL LOW (ref 6–20)
CO2: 20 mmol/L — ABNORMAL LOW (ref 22–32)
Calcium: 9 mg/dL (ref 8.9–10.3)
Chloride: 94 mmol/L — ABNORMAL LOW (ref 98–111)
Creatinine, Ser: 0.61 mg/dL (ref 0.44–1.00)
GFR, Estimated: >60 mL/min (ref >60)
Glucose, Bld: 76 mg/dL (ref 70–99)
Potassium: 3.9 mmol/L (ref 3.5–5.1)
Sodium: 131 mmol/L — ABNORMAL LOW (ref 135–145)
Total Bilirubin: 1 mg/dL (ref 0.0–1.2)
Total Protein: 6.5 g/dL (ref 6.5–8.1)

## 2024-07-23 LAB — RESP PANEL BY RT-PCR (RSV, FLU A&B, COVID)  RVPGX2
Influenza A by PCR: NEGATIVE
Influenza B by PCR: NEGATIVE
Resp Syncytial Virus by PCR: NEGATIVE
SARS Coronavirus 2 by RT PCR: NEGATIVE

## 2024-07-23 LAB — CBG MONITORING, ED
Glucose-Capillary: 67 mg/dL — ABNORMAL LOW (ref 70–99)
Glucose-Capillary: 75 mg/dL (ref 70–99)
Glucose-Capillary: 81 mg/dL (ref 70–99)
Glucose-Capillary: 67 mg/dL — ABNORMAL LOW (ref 70–99)
Glucose-Capillary: 91 mg/dL (ref 70–99)

## 2024-07-23 LAB — CBC
HCT: 36.7 % (ref 36.0–46.0)
Hemoglobin: 13.1 g/dL (ref 12.0–15.0)
MCH: 32.3 pg (ref 26.0–34.0)
MCHC: 35.7 g/dL (ref 30.0–36.0)
MCV: 90.4 fL (ref 80.0–100.0)
Platelets: 205 K/uL (ref 150–400)
RBC: 4.06 MIL/uL (ref 3.87–5.11)
RDW: 11.9 % (ref 11.5–15.5)
WBC: 3.9 K/uL — ABNORMAL LOW (ref 4.0–10.5)
nRBC: 0 % (ref 0.0–0.2)

## 2024-07-23 LAB — TROPONIN I (HIGH SENSITIVITY)
Troponin I (High Sensitivity): 5 ng/L (ref <18)
Troponin I (High Sensitivity): 4 ng/L (ref <18)

## 2024-07-23 LAB — RAPID URINE DRUG SCREEN, HOSP PERFORMED
Amphetamines: NOT DETECTED
Barbiturates: NOT DETECTED
Benzodiazepines: NOT DETECTED
Cocaine: NOT DETECTED
Opiates: NOT DETECTED
Tetrahydrocannabinol: NOT DETECTED

## 2024-07-23 LAB — PREGNANCY, URINE: Preg Test, Ur: NEGATIVE

## 2024-07-23 LAB — ETHANOL: Alcohol, Ethyl (B): 143 mg/dL — ABNORMAL HIGH (ref <15)

## 2024-07-23 LAB — AMMONIA: Ammonia: 24 umol/L (ref 9–35)

## 2024-07-23 MED ORDER — SODIUM CHLORIDE 0.9 % IV BOLUS
1000.0000 mL | Freq: Once | INTRAVENOUS | Status: AC
  Administered 2024-07-23: 1000 mL via INTRAVENOUS

## 2024-07-23 MED ORDER — IOHEXOL 350 MG/ML SOLN
75.0000 mL | Freq: Once | INTRAVENOUS | Status: AC | PRN
  Administered 2024-07-23: 75 mL via INTRAVENOUS

## 2024-07-23 NOTE — Discharge Instructions (Addendum)
 Please follow-up with your primary doctor regarding medication adjustments.  Also, follow-up with your liver doctor regarding your elevated liver enzymes.  You may go to the behavioral health urgent care if you are having problems with alcohol for help.  Return for fevers, chills, headache, lethargy, seizures, chest pain, shortness of breath, palpitations, uncontrolled nausea vomiting or any new or worsening symptoms that are concerning to you.

## 2024-07-23 NOTE — ED Notes (Signed)
Pt given more juice per MD

## 2024-07-23 NOTE — ED Provider Notes (Signed)
 [place] EMERGENCY DEPARTMENT AT [facility] Provider Note  CSN: [id number] Arrival date & time: 07/23/24 1108  Chief Complaint(s) Weakness  HPI Tabitha Carr is a 53 y.o. female with past medical history as below, significant for LFTs, IBS, migraines, gastroenteritis, pancreatitis, depression who presents to the ED with complaint of malaise  Patient reports she was recent started on Nebivilol that she took around 3:00 this morning.  While she was at work she felt very tired, lightheaded, laid her head down on her desk because she was very fatigued.  She had some mild nausea.  No chest pain or dyspnea or palpitations.  No headache or vision or hearing changes.  No change in bowel or bladder function.  No fevers or chills.  No illicit drug use or recent alcohol use.  Denies similar symptoms in the past.  Past Medical History Past Medical History:  Diagnosis Date   Abnormal LFTs    Adult ADHD    Depression    Insomnia    Irritable bowel syndrome with both constipation and diarrhea    Migraines    Vitamin D deficiency    Patient Active Problem List   Diagnosis Date Noted   Acute gastroenteritis 03/11/2021   Severe sepsis (HCC) 03/11/2021   AKI (acute kidney injury) 03/11/2021   Fatty liver 10/02/2018   Acute pancreatitis 07/11/2018   Vitamin D deficiency 09/26/2017   Abnormal LFTs (liver function tests) 04/26/2017   Chronic insomnia 10/18/2016   Irritable bowel syndrome with both constipation and diarrhea 10/18/2016   ADHD (attention deficit hyperactivity disorder) 07/07/2015   Depression 07/07/2015   Migraines 07/07/2015   Adult ADHD 06/15/2015   Home Medication(s) Prior to Admission medications   Medication Sig Start Date End Date Taking? Authorizing Provider  Cholecalciferol (VITAMIN D3) 125 MCG (5000 UT) TABS Take 5,000 Units by mouth at bedtime.    [provider]  Melatonin 10 MG TABS Take 10 mg by mouth at bedtime.    [provider]   pantoprazole  (PROTONIX ) 40 MG tablet Take 40 mg by mouth at bedtime.    [provider]  zolpidem  (AMBIEN ) 10 MG tablet Take 10 mg by mouth at bedtime.    [provider]  benazepril-hydrochlorthiazide (LOTENSIN HCT) 20-12.5 MG tablet Take 1 tablet by mouth daily. 03/10/21 03/12/21  [provider]  phentermine (ADIPEX-P) 37.5 MG tablet Take 37.5 mg by mouth daily before breakfast. Patient not taking: Reported on 03/11/2021  03/12/21  [provider]                                                                                                                                    Past Surgical History Past Surgical History:  Procedure Laterality Date   HERNIA REPAIR     TUBAL LIGATION     Family History History reviewed. No pertinent family history.  Social History Social History   Tobacco Use  Smoking status: Never   Smokeless tobacco: Never  Substance Use Topics   Alcohol use: Yes   Drug use: Never   Allergies Latex and Meloxicam  Review of Systems A thorough review of systems was obtained and all systems are negative except as noted in the HPI and PMH.   Physical Exam Vital Signs  I have reviewed the triage vital signs BP (!) 166/98   Pulse 71   Temp 97.7 F (36.5 C) (Oral)   Resp 15   Ht 5' 4 (1.626 m)   Wt 72.6 kg   SpO2 100%   BMI 27.46 kg/m  Physical Exam Vitals and nursing note reviewed.  Constitutional:      General: She is not in acute distress.    Appearance: Normal appearance.  HENT:     Head: Normocephalic and atraumatic.     Right Ear: External ear normal.     Left Ear: External ear normal.     Nose: Nose normal.     Mouth/Throat:     Mouth: Mucous membranes are dry.  Eyes:     General: No scleral icterus.       Right eye: No discharge.        Left eye: No discharge.  Cardiovascular:     Rate and Rhythm: Normal rate and regular rhythm.     Pulses: Normal pulses.     Heart sounds: Normal heart sounds.   Pulmonary:     Effort: Pulmonary effort is normal. No respiratory distress.     Breath sounds: Normal breath sounds. No stridor.  Abdominal:     General: Abdomen is flat. There is no distension.     Palpations: Abdomen is soft.     Tenderness: There is no abdominal tenderness.  Musculoskeletal:     Cervical back: No rigidity.     Right lower leg: No edema.     Left lower leg: No edema.  Skin:    General: Skin is warm and dry.     Capillary Refill: Capillary refill takes less than 2 seconds.  Neurological:     Mental Status: She is alert and oriented to person, place, and time.     GCS: GCS eye subscore is 4. GCS verbal subscore is 5. GCS motor subscore is 6.     Cranial Nerves: No dysarthria.     Motor: No tremor.  Psychiatric:        Mood and Affect: Mood normal.        Behavior: Behavior normal. Behavior is cooperative.     ED Results and Treatments Labs (all labs ordered are listed, but only abnormal results are displayed) Labs Reviewed  COMPREHENSIVE METABOLIC PANEL WITH GFR - Abnormal; Notable for the following components:      Result Value   Sodium 131 (*)    Chloride 94 (*)    CO2 20 (*)    BUN <5 (*)    AST 459 (*)    ALT 224 (*)    Anion gap 17 (*)    All other components within normal limits  CBC - Abnormal; Notable for the following components:   WBC 3.9 (*)    All other components within normal limits  URINALYSIS, ROUTINE W REFLEX MICROSCOPIC - Abnormal; Notable for the following components:   Color, Urine STRAW (*)    Specific Gravity, Urine 1.001 (*)    All other components within normal limits  CBG MONITORING, ED - Abnormal; Notable for the following components:   Glucose-Capillary  67 (*)    All other components within normal limits  RESP PANEL BY RT-PCR (RSV, FLU A&B, COVID)  RVPGX2  PREGNANCY, URINE  AMMONIA  ETHANOL  RAPID URINE DRUG SCREEN, HOSP PERFORMED  CBG MONITORING, ED  CBG MONITORING, ED  TROPONIN I (HIGH SENSITIVITY)                                                                                                                           Radiology DG Chest Portable 1 View Result Date: 07/23/2024 CLINICAL DATA:  Generalized weakness EXAM: PORTABLE CHEST 1 VIEW COMPARISON:  01/11/2020 FINDINGS: The heart size and mediastinal contours are within normal limits. Both lungs are clear. The visualized skeletal structures are unremarkable. IMPRESSION: No active disease. Electronically Signed   By: CHRISTELLA.  Shick M.D.   On: 07/23/2024 12:34    Pertinent labs & imaging results that were available during my care of the patient were reviewed by me and considered in my medical decision making (see MDM for details).  Medications Ordered in ED Medications  sodium chloride  0.9 % bolus 1,000 mL (1,000 mLs Intravenous New Bag/Given 07/23/24 1225)                                                                                                                                     Procedures Procedures  (including critical care time)  Medical Decision Making / ED Course    Medical Decision Making:    ALLAYNA ERLICH is a 53 y.o. female with past medical history as below, significant for LFTs, IBS, migraines, gastroenteritis, pancreatitis, depression who presents to the ED with complaint of malaise. The complaint involves an extensive differential diagnosis and also carries with it a high risk of complications and morbidity.  Serious etiology was considered. Ddx includes but is not limited to: Medication effect, dehydration, hypoglycemia, infection, etc.  Complete initial physical exam performed, notably the patient was in no acute distress.    Reviewed and confirmed nursing documentation for past medical history, family history, social history.  Vital signs reviewed.    Generalized weakness> - She was started on Nebivolol, took first dose this morning around 3 AM - She was hypoglycemic on arrival, given orange juice and this has since  improved - She is elevated liver enzymes       ***  Additional history obtained: -Additional history obtained from {wsadditionalhistorian:28072} -External records from outside source obtained and reviewed including: Chart review including previous notes, labs, imaging, consultation notes including  ***   Lab Tests: -I ordered, reviewed, and interpreted labs.   The pertinent results include:   Labs Reviewed  COMPREHENSIVE METABOLIC PANEL WITH GFR - Abnormal; Notable for the following components:      Result Value   Sodium 131 (*)    Chloride 94 (*)    CO2 20 (*)    BUN <5 (*)    AST 459 (*)    ALT 224 (*)    Anion gap 17 (*)    All other components within normal limits  CBC - Abnormal; Notable for the following components:   WBC 3.9 (*)    All other components within normal limits  URINALYSIS, ROUTINE W REFLEX MICROSCOPIC - Abnormal; Notable for the following components:   Color, Urine STRAW (*)    Specific Gravity, Urine 1.001 (*)    All other components within normal limits  CBG MONITORING, ED - Abnormal; Notable for the following components:   Glucose-Capillary 67 (*)    All other components within normal limits  RESP PANEL BY RT-PCR (RSV, FLU A&B, COVID)  RVPGX2  PREGNANCY, URINE  AMMONIA  ETHANOL  RAPID URINE DRUG SCREEN, HOSP PERFORMED  CBG MONITORING, ED  CBG MONITORING, ED  TROPONIN I (HIGH SENSITIVITY)    Notable for ***  EKG   EKG Interpretation Date/Time:    Ventricular Rate:    PR Interval:    QRS Duration:    QT Interval:    QTC Calculation:   R Axis:      Text Interpretation:           Imaging Studies ordered: I ordered imaging studies including *** I independently visualized the following imaging with scope of interpretation limited to determining acute life threatening conditions related to emergency care; findings noted above I agree with the radiologist interpretation If any imaging was obtained with  contrast I closely monitored patient for any possible adverse reaction a/w contrast administration in the emergency department   Medicines ordered and prescription drug management: Meds ordered this encounter  Medications   sodium chloride  0.9 % bolus 1,000 mL    -I have reviewed the patients home medicines and have made adjustments as needed   Consultations Obtained: I requested consultation with the ***,  and discussed lab and imaging findings as well as pertinent plan - they recommend: ***   Cardiac Monitoring: The patient was maintained on a cardiac monitor.  I personally viewed and interpreted the cardiac monitored which showed an underlying rhythm of: *** Continuous pulse oximetry interpreted by myself, ***% on ***.    Social Determinants of Health:  Diagnosis or treatment significantly limited by social determinants of health: {wssoc:28071}   Reevaluation: After the interventions noted above, I reevaluated the patient and found that they have {resolved/improved/worsened:23923::improved}  Co morbidities that complicate the patient evaluation  Past Medical History:  Diagnosis Date   Abnormal LFTs    Adult ADHD    Depression    Insomnia    Irritable bowel syndrome with both constipation and diarrhea    Migraines    Vitamin D deficiency       Dispostion: Disposition decision including need for hospitalization was considered, and patient {wsdispo:28070::discharged from emergency department.}    Final Clinical Impression(s) / ED Diagnoses Final diagnoses:  None

## 2024-07-23 NOTE — ED Notes (Signed)
 CBG 67, RN and MD notified

## 2024-07-23 NOTE — ED Triage Notes (Signed)
 Pt arrives via EMS from home with generalized weakness starting around 9 today. Pt reports starting a new BP med today around 3 am. Denies pain or dizziness.

## 2024-07-23 NOTE — ED Notes (Signed)
 Pt given orange juice per Dr. Elnor to bring sugar up

## 2024-07-23 NOTE — ED Provider Notes (Signed)
 Clinical Course as of 07/23/24 1830  Wed Jul 23, 2024  1444 Alcohol, Ethyl (B)(!): 143 [SG]  1518 Signout; New BP med last night. Felt tired at home. Pending CT scan and eat. F/u for LFT elvations outpatient.  [TY]  [id number] Patient reevaluated after observation.  Maintaining her blood sugar.  CT scan with no significant abnormality other than some fatty liver.  Discussed with patient elevated liver enzymes and CT findings.  She states she has a liver doctor that she follows with.  Encouraged her to follow-up with them.  Reports ambulating to the bathroom with some minor lightheadedness but okay.  She feels comfortable going home and states she is feeling significantly improved.  Given return precautions.  Discharged in stable condition. [TY]    Clinical Course User Index [SG] Elnor Jayson LABOR, DO [TY] Neysa Caron PARAS, DO      Neysa Caron PARAS, [place] 07/23/24 [phone]

## 2024-07-23 NOTE — ED Notes (Signed)
 Gave Pt graham crackers at time.
